# Patient Record
Sex: Male | Born: 1975 | Race: Black or African American | Hispanic: No | Smoking: Current some day smoker
Health system: Southern US, Community
[De-identification: ages and names within clinical notes are randomized; demographics above are authoritative.]

## PROBLEM LIST (undated history)

## (undated) DIAGNOSIS — M545 Low back pain, unspecified: Secondary | ICD-10-CM

## (undated) DIAGNOSIS — K852 Alcohol induced acute pancreatitis without necrosis or infection: Secondary | ICD-10-CM

## (undated) DIAGNOSIS — R569 Unspecified convulsions: Secondary | ICD-10-CM

## (undated) DIAGNOSIS — G8929 Other chronic pain: Secondary | ICD-10-CM

## (undated) DIAGNOSIS — I10 Essential (primary) hypertension: Secondary | ICD-10-CM

---

## 2002-12-06 HISTORY — PX: ACHILLES TENDON REPAIR: SUR1153

## 2015-04-06 DIAGNOSIS — R569 Unspecified convulsions: Secondary | ICD-10-CM

## 2015-04-06 HISTORY — DX: Unspecified convulsions: R56.9

## 2015-10-21 ENCOUNTER — Inpatient Hospital Stay (HOSPITAL_BASED_OUTPATIENT_CLINIC_OR_DEPARTMENT_OTHER)
Admission: EM | Admit: 2015-10-21 | Discharge: 2015-10-30 | DRG: 438 | Disposition: A | Discharge status: Home / Self Care | Payer: Self-pay | Attending: Internal Medicine | Admitting: Internal Medicine

## 2015-10-21 ENCOUNTER — Encounter (HOSPITAL_BASED_OUTPATIENT_CLINIC_OR_DEPARTMENT_OTHER): Payer: Self-pay

## 2015-10-21 ENCOUNTER — Emergency Department (HOSPITAL_BASED_OUTPATIENT_CLINIC_OR_DEPARTMENT_OTHER): Payer: Self-pay

## 2015-10-21 DIAGNOSIS — F419 Anxiety disorder, unspecified: Secondary | ICD-10-CM | POA: Diagnosis present

## 2015-10-21 DIAGNOSIS — J189 Pneumonia, unspecified organism: Secondary | ICD-10-CM

## 2015-10-21 DIAGNOSIS — R0902 Hypoxemia: Secondary | ICD-10-CM

## 2015-10-21 DIAGNOSIS — R509 Fever, unspecified: Secondary | ICD-10-CM | POA: Insufficient documentation

## 2015-10-21 DIAGNOSIS — N141 Nephropathy induced by other drugs, medicaments and biological substances: Secondary | ICD-10-CM | POA: Diagnosis present

## 2015-10-21 DIAGNOSIS — F10239 Alcohol dependence with withdrawal, unspecified: Secondary | ICD-10-CM | POA: Diagnosis present

## 2015-10-21 DIAGNOSIS — J9601 Acute respiratory failure with hypoxia: Secondary | ICD-10-CM | POA: Diagnosis present

## 2015-10-21 DIAGNOSIS — T508X5A Adverse effect of diagnostic agents, initial encounter: Secondary | ICD-10-CM | POA: Diagnosis present

## 2015-10-21 DIAGNOSIS — J90 Pleural effusion, not elsewhere classified: Secondary | ICD-10-CM | POA: Diagnosis present

## 2015-10-21 DIAGNOSIS — R188 Other ascites: Secondary | ICD-10-CM | POA: Diagnosis present

## 2015-10-21 DIAGNOSIS — K567 Ileus, unspecified: Secondary | ICD-10-CM | POA: Diagnosis not present

## 2015-10-21 DIAGNOSIS — R109 Unspecified abdominal pain: Secondary | ICD-10-CM

## 2015-10-21 DIAGNOSIS — R609 Edema, unspecified: Secondary | ICD-10-CM

## 2015-10-21 DIAGNOSIS — G8929 Other chronic pain: Secondary | ICD-10-CM | POA: Diagnosis present

## 2015-10-21 DIAGNOSIS — D696 Thrombocytopenia, unspecified: Secondary | ICD-10-CM | POA: Diagnosis not present

## 2015-10-21 DIAGNOSIS — K859 Acute pancreatitis without necrosis or infection, unspecified: Secondary | ICD-10-CM | POA: Insufficient documentation

## 2015-10-21 DIAGNOSIS — F17200 Nicotine dependence, unspecified, uncomplicated: Secondary | ICD-10-CM | POA: Diagnosis present

## 2015-10-21 DIAGNOSIS — J9811 Atelectasis: Secondary | ICD-10-CM | POA: Diagnosis not present

## 2015-10-21 DIAGNOSIS — F10939 Alcohol use, unspecified with withdrawal, unspecified: Secondary | ICD-10-CM | POA: Clinically undetermined

## 2015-10-21 DIAGNOSIS — N19 Unspecified kidney failure: Secondary | ICD-10-CM

## 2015-10-21 DIAGNOSIS — D539 Nutritional anemia, unspecified: Secondary | ICD-10-CM | POA: Insufficient documentation

## 2015-10-21 DIAGNOSIS — M545 Low back pain: Secondary | ICD-10-CM | POA: Diagnosis present

## 2015-10-21 DIAGNOSIS — E876 Hypokalemia: Secondary | ICD-10-CM | POA: Diagnosis present

## 2015-10-21 DIAGNOSIS — K863 Pseudocyst of pancreas: Secondary | ICD-10-CM

## 2015-10-21 DIAGNOSIS — I10 Essential (primary) hypertension: Secondary | ICD-10-CM | POA: Diagnosis present

## 2015-10-21 DIAGNOSIS — F1010 Alcohol abuse, uncomplicated: Secondary | ICD-10-CM | POA: Diagnosis present

## 2015-10-21 DIAGNOSIS — N179 Acute kidney failure, unspecified: Secondary | ICD-10-CM | POA: Diagnosis present

## 2015-10-21 DIAGNOSIS — Y95 Nosocomial condition: Secondary | ICD-10-CM | POA: Diagnosis not present

## 2015-10-21 DIAGNOSIS — R0600 Dyspnea, unspecified: Secondary | ICD-10-CM

## 2015-10-21 DIAGNOSIS — I429 Cardiomyopathy, unspecified: Secondary | ICD-10-CM | POA: Diagnosis present

## 2015-10-21 DIAGNOSIS — D6959 Other secondary thrombocytopenia: Secondary | ICD-10-CM | POA: Diagnosis present

## 2015-10-21 DIAGNOSIS — N170 Acute kidney failure with tubular necrosis: Secondary | ICD-10-CM | POA: Diagnosis present

## 2015-10-21 DIAGNOSIS — J9690 Respiratory failure, unspecified, unspecified whether with hypoxia or hypercapnia: Secondary | ICD-10-CM | POA: Insufficient documentation

## 2015-10-21 DIAGNOSIS — E861 Hypovolemia: Secondary | ICD-10-CM | POA: Diagnosis present

## 2015-10-21 DIAGNOSIS — K852 Alcohol induced acute pancreatitis without necrosis or infection: Principal | ICD-10-CM | POA: Diagnosis present

## 2015-10-21 DIAGNOSIS — K760 Fatty (change of) liver, not elsewhere classified: Secondary | ICD-10-CM | POA: Diagnosis present

## 2015-10-21 DIAGNOSIS — E872 Acidosis: Secondary | ICD-10-CM | POA: Diagnosis present

## 2015-10-21 HISTORY — DX: Other chronic pain: G89.29

## 2015-10-21 HISTORY — DX: Low back pain: M54.5

## 2015-10-21 HISTORY — DX: Alcohol induced acute pancreatitis without necrosis or infection: K85.20

## 2015-10-21 HISTORY — DX: Unspecified convulsions: R56.9

## 2015-10-21 HISTORY — DX: Essential (primary) hypertension: I10

## 2015-10-21 HISTORY — DX: Low back pain, unspecified: M54.50

## 2015-10-21 LAB — COMPREHENSIVE METABOLIC PANEL
ALT: 47 U/L (ref 17–63)
AST: 77 U/L — ABNORMAL HIGH (ref 15–41)
Albumin: 3.5 g/dL (ref 3.5–5.0)
Alkaline Phosphatase: 54 U/L (ref 38–126)
Anion gap: 11 (ref 5–15)
BUN: 11 mg/dL (ref 6–20)
CALCIUM: 8.5 mg/dL — AB (ref 8.9–10.3)
CHLORIDE: 97 mmol/L — AB (ref 101–111)
CO2: 27 mmol/L (ref 22–32)
CREATININE: 1.67 mg/dL — AB (ref 0.61–1.24)
GFR calc Af Amer: 59 mL/min — ABNORMAL LOW (ref >60)
GFR calc non Af Amer: 50 mL/min — ABNORMAL LOW (ref >60)
GLUCOSE: 162 mg/dL — AB (ref 65–99)
Potassium: 3.2 mmol/L — ABNORMAL LOW (ref 3.5–5.1)
Sodium: 135 mmol/L (ref 135–145)
Total Bilirubin: 1.5 mg/dL — ABNORMAL HIGH (ref 0.3–1.2)
Total Protein: 6.9 g/dL (ref 6.5–8.1)

## 2015-10-21 LAB — CBC WITH DIFFERENTIAL/PLATELET
BASOS PCT: 1 %
Basophils Absolute: 0.1 10*3/uL (ref 0.0–0.1)
Eosinophils Absolute: 0 10*3/uL (ref 0.0–0.7)
Eosinophils Relative: 0 %
HEMATOCRIT: 40.6 % (ref 39.0–52.0)
Hemoglobin: 14 g/dL (ref 13.0–17.0)
LYMPHS PCT: 6 %
Lymphs Abs: 0.5 10*3/uL — ABNORMAL LOW (ref 0.7–4.0)
MCH: 36.6 pg — ABNORMAL HIGH (ref 26.0–34.0)
MCHC: 34.5 g/dL (ref 30.0–36.0)
MCV: 106 fL — AB (ref 78.0–100.0)
MONO ABS: 1.1 10*3/uL — AB (ref 0.1–1.0)
MONOS PCT: 12 %
NEUTROS ABS: 7.3 10*3/uL (ref 1.7–7.7)
Neutrophils Relative %: 81 %
Platelets: 186 10*3/uL (ref 150–400)
RBC: 3.83 MIL/uL — ABNORMAL LOW (ref 4.22–5.81)
RDW: 13.4 % (ref 11.5–15.5)
WBC: 8.9 10*3/uL (ref 4.0–10.5)

## 2015-10-21 LAB — LIPASE, BLOOD: Lipase: 1924 U/L — ABNORMAL HIGH (ref 11–51)

## 2015-10-21 LAB — PROTIME-INR
INR: 1.03 (ref 0.00–1.49)
Prothrombin Time: 13.7 seconds (ref 11.6–15.2)

## 2015-10-21 LAB — MAGNESIUM: Magnesium: 1.4 mg/dL — ABNORMAL LOW (ref 1.7–2.4)

## 2015-10-21 MED ORDER — HEPARIN SODIUM (PORCINE) 5000 UNIT/ML IJ SOLN
5000.0000 [IU] | Freq: Three times a day (TID) | INTRAMUSCULAR | Status: DC
  Administered 2015-10-21 – 2015-10-22 (×3): 5000 [IU] via SUBCUTANEOUS
  Filled 2015-10-21 (×3): qty 1

## 2015-10-21 MED ORDER — LORAZEPAM 2 MG/ML IJ SOLN
1.0000 mg | Freq: Once | INTRAMUSCULAR | Status: AC
  Administered 2015-10-21: 1 mg via INTRAVENOUS
  Filled 2015-10-21: qty 1

## 2015-10-21 MED ORDER — HYDROMORPHONE HCL 1 MG/ML IJ SOLN
1.0000 mg | INTRAMUSCULAR | Status: AC | PRN
  Administered 2015-10-21 (×2): 1 mg via INTRAVENOUS
  Filled 2015-10-21 (×2): qty 1

## 2015-10-21 MED ORDER — LORAZEPAM 1 MG PO TABS
1.0000 mg | ORAL_TABLET | Freq: Four times a day (QID) | ORAL | Status: DC | PRN
Start: 2015-10-21 — End: 2015-10-22

## 2015-10-21 MED ORDER — MAGNESIUM SULFATE 2 GM/50ML IV SOLN
2.0000 g | Freq: Once | INTRAVENOUS | Status: AC
  Administered 2015-10-21: 2 g via INTRAVENOUS
  Filled 2015-10-21: qty 50

## 2015-10-21 MED ORDER — ADULT MULTIVITAMIN W/MINERALS CH
1.0000 | ORAL_TABLET | Freq: Every day | ORAL | Status: DC
  Administered 2015-10-21 – 2015-10-30 (×10): 1 via ORAL
  Filled 2015-10-21 (×12): qty 1

## 2015-10-21 MED ORDER — SODIUM CHLORIDE 0.9 % IV BOLUS (SEPSIS)
1000.0000 mL | Freq: Once | INTRAVENOUS | Status: AC
  Administered 2015-10-21: 1000 mL via INTRAVENOUS

## 2015-10-21 MED ORDER — THIAMINE HCL 100 MG/ML IJ SOLN
100.0000 mg | Freq: Every day | INTRAMUSCULAR | Status: DC
  Filled 2015-10-21 (×5): qty 1

## 2015-10-21 MED ORDER — SODIUM CHLORIDE 0.9 % IV SOLN
INTRAVENOUS | Status: DC
  Administered 2015-10-21 – 2015-10-22 (×2): via INTRAVENOUS
  Administered 2015-10-22: 125 mL/h via INTRAVENOUS
  Administered 2015-10-22 – 2015-10-25 (×6): via INTRAVENOUS

## 2015-10-21 MED ORDER — VITAMIN B-1 100 MG PO TABS
100.0000 mg | ORAL_TABLET | Freq: Every day | ORAL | Status: DC
  Administered 2015-10-21 – 2015-10-30 (×10): 100 mg via ORAL
  Filled 2015-10-21 (×12): qty 1

## 2015-10-21 MED ORDER — MORPHINE SULFATE (PF) 2 MG/ML IV SOLN
2.0000 mg | INTRAVENOUS | Status: DC | PRN
  Administered 2015-10-21 – 2015-10-22 (×4): 4 mg via INTRAVENOUS
  Filled 2015-10-21 (×4): qty 2

## 2015-10-21 MED ORDER — FOLIC ACID 1 MG PO TABS
1.0000 mg | ORAL_TABLET | Freq: Every day | ORAL | Status: DC
  Administered 2015-10-21 – 2015-10-30 (×10): 1 mg via ORAL
  Filled 2015-10-21 (×12): qty 1

## 2015-10-21 MED ORDER — HYDROMORPHONE HCL 1 MG/ML IJ SOLN
1.0000 mg | Freq: Once | INTRAMUSCULAR | Status: AC
  Administered 2015-10-21: 1 mg via INTRAVENOUS
  Filled 2015-10-21: qty 1

## 2015-10-21 MED ORDER — LORAZEPAM 2 MG/ML IJ SOLN
1.0000 mg | Freq: Four times a day (QID) | INTRAMUSCULAR | Status: DC | PRN
  Administered 2015-10-22: 1 mg via INTRAVENOUS
  Filled 2015-10-21: qty 1

## 2015-10-21 MED ORDER — IOHEXOL 300 MG/ML  SOLN
80.0000 mL | Freq: Once | INTRAMUSCULAR | Status: AC | PRN
  Administered 2015-10-21: 80 mL via INTRAVENOUS

## 2015-10-21 MED ORDER — ONDANSETRON HCL 4 MG/2ML IJ SOLN
4.0000 mg | Freq: Once | INTRAMUSCULAR | Status: AC
  Administered 2015-10-21: 4 mg via INTRAVENOUS
  Filled 2015-10-21: qty 2

## 2015-10-21 MED ORDER — POTASSIUM CHLORIDE 10 MEQ/100ML IV SOLN
10.0000 meq | Freq: Once | INTRAVENOUS | Status: AC
  Administered 2015-10-21: 10 meq via INTRAVENOUS
  Filled 2015-10-21: qty 100

## 2015-10-21 MED ORDER — SODIUM CHLORIDE 0.9 % IJ SOLN
3.0000 mL | Freq: Two times a day (BID) | INTRAMUSCULAR | Status: DC
  Administered 2015-10-22 – 2015-10-29 (×6): 3 mL via INTRAVENOUS

## 2015-10-21 NOTE — H&P (Signed)
[facility] History and Physical  Larry SchatzCalvin Maldonado ZOX:[id number] DOB: 1976-06-09 DOA: 10/21/2015  Referring physician: EDP PCP: No primary care provider on file.   Chief Complaint: Abd pain   HPI: Larry SchatzCalvin Carver is a 39 y.o. male who [facility] with c/o epigastric abdominal pain.  Pain onset last night, located in epigastric area.  Mild nausea but no vomiting.  Pain is severe with radiation to back.  Does drink "several beers" daily.  Has had "mild" EtOH withdrawal in the past with seizure.  Review of Systems: Systems reviewed.  As above, otherwise negative  Past Medical History  Diagnosis Date  . Hypertension    Past Surgical History  Procedure Laterality Date  . Achilles tendon repair     Social History:  reports that he has quit smoking. He does not have any smokeless tobacco history on file. He reports that he drinks alcohol. He reports that he does not use illicit drugs.  No Known Allergies  No family history on file.   Prior to Admission medications   Not on File   Physical Exam: Filed Vitals:   10/21/15 2100  BP: 147/90  Pulse: 126  Temp: 100.3 F (37.9 C)  Resp: 18    BP 147/90 mmHg  Pulse 126  Temp(Src) 100.3 F (37.9 C) (Oral)  Resp 18  Ht 6\' 4"  (1.93 m)  Wt 106.595 kg (235 lb)  BMI 28.62 kg/m2  SpO2 97%  General Appearance:    Alert, oriented, no distress, appears stated age  Head:    Normocephalic, atraumatic  Eyes:    PERRL, EOMI, sclera non-icteric        Nose:   Nares without drainage or epistaxis. Mucosa, turbinates normal  Throat:   Moist mucous membranes. Oropharynx without erythema or exudate.  Neck:   Supple. No carotid bruits.  No thyromegaly.  No lymphadenopathy.   Back:     No CVA tenderness, no spinal tenderness  Lungs:     Clear to auscultation bilaterally, without wheezes, rhonchi or rales  Chest wall:    No tenderness to palpitation  Heart:    Regular rate and rhythm without murmurs, gallops, rubs  Abdomen:      Soft, Epigastric tenderness, nondistended, normal bowel sounds, no organomegaly  Genitalia:    deferred  Rectal:    deferred  Extremities:   No clubbing, cyanosis or edema.  Pulses:   2+ and symmetric all extremities  Skin:   Skin color, texture, turgor normal, no rashes or lesions  Lymph nodes:   Cervical, supraclavicular, and axillary nodes normal  Neurologic:   CNII-XII intact. Normal strength, sensation and reflexes      throughout    Labs on Admission:  Basic Metabolic Panel:  Recent Labs Lab 10/21/15 1300  NA 135  K 3.2*  CL 97*  CO2 27  GLUCOSE 162*  BUN 11  CREATININE 1.67*  CALCIUM 8.5*  MG 1.4*   Liver Function Tests:  Recent Labs Lab 10/21/15 1300  AST 77*  ALT 47  ALKPHOS 54  BILITOT 1.5*  PROT 6.9  ALBUMIN 3.5    Recent Labs Lab 10/21/15 1300  LIPASE 1924*   No results for input(s): AMMONIA in the last 168 hours. CBC:  Recent Labs Lab 10/21/15 1300  WBC 8.9  NEUTROABS 7.3  HGB 14.0  HCT 40.6  MCV 106.0*  PLT 186   Cardiac Enzymes: No results for input(s): CKTOTAL, CKMB, CKMBINDEX, TROPONINI in the last 168 hours.  BNP (  last 3 results) No results for input(s): PROBNP in the last 8760 hours. CBG: No results for input(s): GLUCAP in the last 168 hours.  Radiological Exams on Admission: Ct Abdomen Pelvis W Contrast  10/21/2015  CLINICAL DATA:  Upper abdominal pain for 1 day. History of alcoholism. EXAM: CT ABDOMEN AND PELVIS WITH CONTRAST TECHNIQUE: Multidetector CT imaging of the abdomen and pelvis was performed using the standard protocol following bolus administration of intravenous contrast. CONTRAST:  80mL OMNIPAQUE IOHEXOL 300 MG/ML  SOLN COMPARISON:  None. FINDINGS: Lower chest: No significant pulmonary nodules or acute consolidative airspace disease. Hepatobiliary: Diffuse hepatic steatosis. Simple 1.3 cm right liver lobe cyst. No additional liver lesions. No gross liver surface irregularity. Normal gallbladder with no radiopaque  cholelithiasis. No biliary ductal dilatation. Pancreas: There is diffuse thickening of the entire pancreas with prominent peripancreatic fat stranding and ill-defined fluid along the entire length of the pancreas, in keeping with acute pancreatitis. There is a nonspecific 1.0 x 0.7 cm cystic focus in the pancreatic body (series 2/image 35). No main pancreatic duct dilation. No additional focal pancreatic lesions. No pancreatic gas or areas of pancreatic parenchymal hypoenhancement. There is extension of inflammatory fluid into the left greater than right bilateral anterior para renal retroperitoneal space. There is extension of inflammatory fluid into the central mesentery and gastrocolic ligament, with a 7.2 x 2.5 x 3.9 cm left gastrocolic ligament fluid collection. Spleen: Normal size. No mass. Adrenals/Urinary Tract: Normal adrenals. Normal kidneys with no hydronephrosis and no renal mass. Normal bladder. Stomach/Bowel: Grossly normal stomach. Normal caliber small bowel with no small bowel wall thickening. Normal appendix. Normal large bowel with no diverticulosis, large bowel wall thickening or pericolonic fat stranding. Vascular/Lymphatic: Normal caliber abdominal aorta. Patent portal, splenic, hepatic and renal veins. No pathologically enlarged lymph nodes in the abdomen or pelvis. Reproductive: Normal size prostate. Other: No pneumoperitoneum. Small volume ascites, predominantly perihepatic and deep pelvic. Musculoskeletal: No aggressive appearing focal osseous lesions. Severe degenerative disc disease at L5-S1. Prominent Schmorl's nodes in the superior and inferior endplates of the L4 vertebral body. Mild degenerative changes throughout the remaining visualized thoracolumbar spine. Mild gynecomastia, slightly asymmetric to the left. Small fat containing umbilical hernia. Suggestion of a small fat containing left inguinal hernia. IMPRESSION: 1. Acute pancreatitis. No evidence of necrotizing pancreatitis. No  vascular complication. 2. Nonspecific 1.0 cm cystic focus in the pancreatic body, which could represent a developing pseudocyst. Recommend attention on follow-up MRI abdomen with and without intravenous contrast in 3 months. 3. Prominent inflammatory fluid extending into the bilateral retroperitoneum and gastrocolic ligament, with a developing left gastrocolic ligament fluid collection as described. 4. Small volume ascites. 5. Diffuse hepatic steatosis. Electronically Signed   By: Delbert Phenix M.D.   On: 10/21/2015 15:22    EKG: Independently reviewed.  Assessment/Plan Principal Problem:   Acute alcoholic pancreatitis Active Problems:   ETOH abuse   1. Acute EtOH pancreatitis -  1. IVF 2. Intake and output 3. Pain control 4. NPO 5. Repeat CBC / CMP in AM 6. No fever or leukocytosis to indicate ABx at this point, obtain cultures and start if this develops. 2. EtOH abuse - CIWA    Code Status: Full  Family Communication: No family in room Disposition Plan: Admit to inpatient   Time spent: 70 min  GARDNER, JARED M. [facility] Pager [phone]  If 7AM-7PM, please contact the day team taking care of the patient [email or web address] Password [email or web address] 10/21/2015, 10:06 PM

## 2015-10-21 NOTE — Progress Notes (Signed)
39 yr old with alcoholic pancreatitis , hypokalemia  ,, AKI, [facility] , accepted to tele

## 2015-10-21 NOTE — ED Notes (Addendum)
C/o pain to entire abd since last night-denies n/v/d-states it hurts to take a deep breath-pt in no resp distress-during triage, pt's mother states pt with hx of alcohol abuse-pt admits to daily ETOH with last intake last night

## 2015-10-21 NOTE — ED Provider Notes (Signed)
CSN: [id number]     Arrival date & time 10/21/15  1129 History   First MD Initiated Contact with Patient 10/21/15 1322     Chief Complaint  Patient presents with  . Abdominal Pain      HPI  She presents for evaluation of abdominal pain. Pain string early this morning. Mild nausea but no vomiting. Severe epigastric pain and diffuse anterior abdominal pain rating to his mid line back.  Past episodes. Patient does drink daily. States he drinks "several cups" a fog. States this week and he "only had beer". He said one previous seizure in the past that he can recall. Essentially this was during an episode of withdrawal. He sits on days he does not drink he will start Habitrol symptoms by day 2 or 3. No history of otitis or liver abnormalities.  Past Medical History  Diagnosis Date  . Hypertension    Past Surgical History  Procedure Laterality Date  . Achilles tendon repair     No family history on file. Social History  Substance Use Topics  . Smoking status: Former Games developer  . Smokeless tobacco: None  . Alcohol Use: Yes     Comment: daily    Review of Systems  Constitutional: Negative for fever, chills, diaphoresis, appetite change and fatigue.  HENT: Negative for mouth sores, sore throat and trouble swallowing.   Eyes: Negative for visual disturbance.  Respiratory: Negative for cough, chest tightness, shortness of breath and wheezing.   Cardiovascular: Negative for chest pain.  Gastrointestinal: Positive for nausea, vomiting and abdominal pain. Negative for abdominal distention.  Endocrine: Negative for polydipsia, polyphagia and polyuria.  Genitourinary: Negative for dysuria, frequency and hematuria.  Musculoskeletal: Negative for gait problem.  Skin: Negative for color change, pallor and rash.  Neurological: Negative for dizziness, syncope, light-headedness and headaches.  Hematological: Does not bruise/bleed easily.  Psychiatric/Behavioral: Negative for behavioral problems  and confusion.      Allergies  Review of patient's allergies indicates no known allergies.  Home Medications   Prior to Admission medications   Not on File   BP 134/95 mmHg  Pulse 112  Temp(Src) 98.6 F (37 C) (Oral)  Resp 18  Ht  (1.93 m)  Wt 235 lb (106.595 kg)  BMI 28.62 kg/m2  SpO2 99% Physical Exam  Constitutional: He is oriented to person, place, and time. He appears well-developed and well-nourished. No distress.  HENT:  Head: Normocephalic.  Eyes: Conjunctivae are normal. Pupils are equal, round, and reactive to light. No scleral icterus.  Neck: Normal range of motion. Neck supple. No thyromegaly present.  Cardiovascular: Normal rate and regular rhythm.  Exam reveals no gallop and no friction rub.   No murmur heard. Pulmonary/Chest: Effort normal and breath sounds normal. No respiratory distress. He has no wheezes. He has no rales.  Abdominal: Soft. Bowel sounds are normal. He exhibits distension. He exhibits no shifting dullness, no fluid wave and no ascites. There is tenderness in the epigastric area. There is no rebound.  Musculoskeletal: Normal range of motion.  Neurological: He is alert and oriented to person, place, and time.  Skin: Skin is warm and dry. No rash noted.  Psychiatric: He has a normal mood and affect. His behavior is normal.    ED Course  Procedures (including critical care time) Labs Review Labs Reviewed  CBC WITH DIFFERENTIAL/PLATELET - Abnormal; [id number] for the following:    RBC 3.83 (*)    MCV 106.0 (*)    MCH 36.6 (*)  Lymphs Abs 0.5 (*)    Monocytes Absolute 1.1 (*)    All other components within normal limits  COMPREHENSIVE METABOLIC PANEL - Abnormal; Notable for the following:    Potassium 3.2 (*)    Chloride 97 (*)    Glucose, Bld 162 (*)    Creatinine, Ser 1.67 (*)    Calcium 8.5 (*)    AST 77 (*)    Total Bilirubin 1.5 (*)    GFR calc non Af Amer 50 (*)    GFR calc Af Amer 59 (*)    All other components within  normal limits  LIPASE, BLOOD - Abnormal; Notable for the following:    Lipase 1924 (*)    All other components within normal limits  URINALYSIS, ROUTINE W REFLEX MICROSCOPIC (NOT AT [facility])  PROTIME-INR  MAGNESIUM    Imaging Review Ct Abdomen Pelvis W Contrast  10/21/2015  CLINICAL DATA:  Upper abdominal pain for 1 day. History of alcoholism. EXAM: CT ABDOMEN AND PELVIS WITH CONTRAST TECHNIQUE: Multidetector CT imaging of the abdomen and pelvis was performed using the standard protocol following bolus administration of intravenous contrast. CONTRAST:  80mL OMNIPAQUE IOHEXOL 300 MG/ML  SOLN COMPARISON:  None. FINDINGS: Lower chest: No significant pulmonary nodules or acute consolidative airspace disease. Hepatobiliary: Diffuse hepatic steatosis. Simple 1.3 cm right liver lobe cyst. No additional liver lesions. No gross liver surface irregularity. Normal gallbladder with no radiopaque cholelithiasis. No biliary ductal dilatation. Pancreas: There is diffuse thickening of the entire pancreas with prominent peripancreatic fat stranding and ill-defined fluid along the entire length of the pancreas, in keeping with acute pancreatitis. There is a nonspecific 1.0 x 0.7 cm cystic focus in the pancreatic body (series 2/image 35). No main pancreatic duct dilation. No additional focal pancreatic lesions. No pancreatic gas or areas of pancreatic parenchymal hypoenhancement. There is extension of inflammatory fluid into the left greater than right bilateral anterior para renal retroperitoneal space. There is extension of inflammatory fluid into the central mesentery and gastrocolic ligament, with a 7.2 x 2.5 x 3.9 cm left gastrocolic ligament fluid collection. Spleen: Normal size. No mass. Adrenals/Urinary Tract: Normal adrenals. Normal kidneys with no hydronephrosis and no renal mass. Normal bladder. Stomach/Bowel: Grossly normal stomach. Normal caliber small bowel with no small bowel wall thickening. Normal appendix.  Normal large bowel with no diverticulosis, large bowel wall thickening or pericolonic fat stranding. Vascular/Lymphatic: Normal caliber abdominal aorta. Patent portal, splenic, hepatic and renal veins. No pathologically enlarged lymph nodes in the abdomen or pelvis. Reproductive: Normal size prostate. Other: No pneumoperitoneum. Small volume ascites, predominantly perihepatic and deep pelvic. Musculoskeletal: No aggressive appearing focal osseous lesions. Severe degenerative disc disease at L5-S1. Prominent Schmorl's nodes in the superior and inferior endplates of the L4 vertebral body. Mild degenerative changes throughout the remaining visualized thoracolumbar spine. Mild gynecomastia, slightly asymmetric to the left. Small fat containing umbilical hernia. Suggestion of a small fat containing left inguinal hernia. IMPRESSION: 1. Acute pancreatitis. No evidence of necrotizing pancreatitis. No vascular complication. 2. Nonspecific 1.0 cm cystic focus in the pancreatic body, which could represent a developing pseudocyst. Recommend attention on follow-up MRI abdomen with and without intravenous contrast in 3 months. 3. Prominent inflammatory fluid extending into the bilateral retroperitoneum and gastrocolic ligament, with a developing left gastrocolic ligament fluid collection as described. 4. Small volume ascites. 5. Diffuse hepatic steatosis. Electronically Signed   By: Delbert Phenix M.D.   On: 10/21/2015 15:22   I have personally reviewed and evaluated these images and lab  results as part of my medical decision-making.   EKG Interpretation None      MDM   Final diagnoses:  Acute pancreatitis, unspecified pancreatitis type    CT and labs show a pancreatitis without obvious abscess. However marked inflammatory fluid noted. Patient had improvement after IV pain medications. Not showing significant withdrawal as she had a mild tremor. Given Ativan for this. Potassium is being replaced. Magnesium pending.  Care discussed with Dr. Domenica FailAcuna. [facility]    Rolland PorterMark Leverett Camplin, MD 10/21/15 [phone]

## 2015-10-21 NOTE — Progress Notes (Signed)
Patient just arrived to [facility]. Updated report received from [facility]. Patient stable.

## 2015-10-21 NOTE — Progress Notes (Signed)
MD paged for patient arrival to floor. Awaiting response.

## 2015-10-22 ENCOUNTER — Inpatient Hospital Stay (HOSPITAL_COMMUNITY): Payer: Self-pay

## 2015-10-22 DIAGNOSIS — N179 Acute kidney failure, unspecified: Secondary | ICD-10-CM | POA: Diagnosis present

## 2015-10-22 DIAGNOSIS — J9690 Respiratory failure, unspecified, unspecified whether with hypoxia or hypercapnia: Secondary | ICD-10-CM | POA: Insufficient documentation

## 2015-10-22 DIAGNOSIS — D696 Thrombocytopenia, unspecified: Secondary | ICD-10-CM

## 2015-10-22 DIAGNOSIS — F10239 Alcohol dependence with withdrawal, unspecified: Secondary | ICD-10-CM | POA: Clinically undetermined

## 2015-10-22 DIAGNOSIS — J9601 Acute respiratory failure with hypoxia: Secondary | ICD-10-CM

## 2015-10-22 DIAGNOSIS — R000 Tachycardia, unspecified: Secondary | ICD-10-CM

## 2015-10-22 DIAGNOSIS — F1010 Alcohol abuse, uncomplicated: Secondary | ICD-10-CM

## 2015-10-22 DIAGNOSIS — F10939 Alcohol use, unspecified with withdrawal, unspecified: Secondary | ICD-10-CM | POA: Clinically undetermined

## 2015-10-22 DIAGNOSIS — F10230 Alcohol dependence with withdrawal, uncomplicated: Secondary | ICD-10-CM

## 2015-10-22 LAB — COMPREHENSIVE METABOLIC PANEL
ALT: 36 U/L (ref 17–63)
ANION GAP: 10 (ref 5–15)
AST: 56 U/L — ABNORMAL HIGH (ref 15–41)
Albumin: 2.5 g/dL — ABNORMAL LOW (ref 3.5–5.0)
Alkaline Phosphatase: 43 U/L (ref 38–126)
BUN: 17 mg/dL (ref 6–20)
CHLORIDE: 101 mmol/L (ref 101–111)
CO2: 25 mmol/L (ref 22–32)
CREATININE: 3.2 mg/dL — AB (ref 0.61–1.24)
Calcium: 8.2 mg/dL — ABNORMAL LOW (ref 8.9–10.3)
GFR calc Af Amer: 27 mL/min — ABNORMAL LOW (ref >60)
GFR, EST NON AFRICAN AMERICAN: 23 mL/min — AB (ref >60)
Glucose, Bld: 113 mg/dL — ABNORMAL HIGH (ref 65–99)
Potassium: 3.6 mmol/L (ref 3.5–5.1)
SODIUM: 136 mmol/L (ref 135–145)
Total Bilirubin: 1.6 mg/dL — ABNORMAL HIGH (ref 0.3–1.2)
Total Protein: 5.5 g/dL — ABNORMAL LOW (ref 6.5–8.1)

## 2015-10-22 LAB — CBC
HCT: 36.9 % — ABNORMAL LOW (ref 39.0–52.0)
HEMOGLOBIN: 13 g/dL (ref 13.0–17.0)
MCH: 37 pg — AB (ref 26.0–34.0)
MCHC: 35.2 g/dL (ref 30.0–36.0)
MCV: 105.1 fL — AB (ref 78.0–100.0)
PLATELETS: 80 10*3/uL — AB (ref 150–400)
RBC: 3.51 MIL/uL — AB (ref 4.22–5.81)
RDW: 15 % (ref 11.5–15.5)
WBC: 9.1 10*3/uL (ref 4.0–10.5)

## 2015-10-22 LAB — BLOOD GAS, ARTERIAL
ACID-BASE DEFICIT: 3.6 mmol/L — AB (ref 0.0–2.0)
Bicarbonate: 20 mEq/L (ref 20.0–24.0)
DRAWN BY: 44589
FIO2: 0.21
O2 Content: 4 L/min
O2 Saturation: 94.1 %
PATIENT TEMPERATURE: 98.6
PCO2 ART: 30.9 mmHg — AB (ref 35.0–45.0)
PO2 ART: 70.2 mmHg — AB (ref 80.0–100.0)
TCO2: 21 mmol/L (ref 0–100)
pH, Arterial: 7.427 (ref 7.350–7.450)

## 2015-10-22 LAB — URINALYSIS, ROUTINE W REFLEX MICROSCOPIC
GLUCOSE, UA: NEGATIVE mg/dL
Ketones, ur: 15 mg/dL — AB
NITRITE: POSITIVE — AB
PH: 5 (ref 5.0–8.0)
Protein, ur: 100 mg/dL — AB
SPECIFIC GRAVITY, URINE: 1.035 — AB (ref 1.005–1.030)

## 2015-10-22 LAB — DIC (DISSEMINATED INTRAVASCULAR COAGULATION)PANEL
INR: 1.2 (ref 0.00–1.49)
Prothrombin Time: 15.4 seconds — ABNORMAL HIGH (ref 11.6–15.2)
aPTT: 31 seconds (ref 24–37)

## 2015-10-22 LAB — BASIC METABOLIC PANEL
Anion gap: 8 (ref 5–15)
BUN: 25 mg/dL — AB (ref 6–20)
CALCIUM: 7.4 mg/dL — AB (ref 8.9–10.3)
CO2: 22 mmol/L (ref 22–32)
CREATININE: 3.59 mg/dL — AB (ref 0.61–1.24)
Chloride: 106 mmol/L (ref 101–111)
GFR calc Af Amer: 23 mL/min — ABNORMAL LOW (ref >60)
GFR, EST NON AFRICAN AMERICAN: 20 mL/min — AB (ref >60)
GLUCOSE: 104 mg/dL — AB (ref 65–99)
POTASSIUM: 4.2 mmol/L (ref 3.5–5.1)
SODIUM: 136 mmol/L (ref 135–145)

## 2015-10-22 LAB — DIC (DISSEMINATED INTRAVASCULAR COAGULATION) PANEL
FIBRINOGEN: 582 mg/dL — AB (ref 204–475)
PLATELETS: 72 10*3/uL — AB (ref 150–400)
SMEAR REVIEW: NONE SEEN

## 2015-10-22 LAB — SODIUM, URINE, RANDOM: Sodium, Ur: 19 mmol/L

## 2015-10-22 LAB — CREATININE, URINE, RANDOM: CREATININE, URINE: 284.68 mg/dL

## 2015-10-22 LAB — TSH: TSH: 3.614 u[IU]/mL (ref 0.350–4.500)

## 2015-10-22 LAB — LACTIC ACID, PLASMA: LACTIC ACID, VENOUS: 1.7 mmol/L (ref 0.5–2.0)

## 2015-10-22 LAB — URINE MICROSCOPIC-ADD ON: Bacteria, UA: NONE SEEN

## 2015-10-22 LAB — PROCALCITONIN: Procalcitonin: 6.94 ng/mL

## 2015-10-22 LAB — LIPASE, BLOOD: Lipase: 1145 U/L — ABNORMAL HIGH (ref 11–51)

## 2015-10-22 MED ORDER — SODIUM CHLORIDE 0.9 % IV SOLN
1250.0000 mg | INTRAVENOUS | Status: DC
  Filled 2015-10-22: qty 1250

## 2015-10-22 MED ORDER — VANCOMYCIN HCL 10 G IV SOLR
2000.0000 mg | Freq: Once | INTRAVENOUS | Status: AC
  Administered 2015-10-22: 2000 mg via INTRAVENOUS
  Filled 2015-10-22: qty 2000

## 2015-10-22 MED ORDER — SODIUM CHLORIDE 0.9 % IV BOLUS (SEPSIS)
500.0000 mL | Freq: Once | INTRAVENOUS | Status: AC
  Administered 2015-10-22: 500 mL via INTRAVENOUS

## 2015-10-22 MED ORDER — LORAZEPAM 1 MG PO TABS: 1.0000 mg | ORAL_TABLET | Freq: Four times a day (QID) | ORAL | Status: AC | PRN

## 2015-10-22 MED ORDER — SODIUM CHLORIDE 0.9 % IV BOLUS (SEPSIS)
1000.0000 mL | Freq: Once | INTRAVENOUS | Status: AC
  Administered 2015-10-22: 1000 mL via INTRAVENOUS

## 2015-10-22 MED ORDER — FENTANYL CITRATE (PF) 100 MCG/2ML IJ SOLN
INTRAMUSCULAR | Status: AC
  Filled 2015-10-22: qty 2

## 2015-10-22 MED ORDER — FENTANYL CITRATE (PF) 100 MCG/2ML IJ SOLN
25.0000 ug | INTRAMUSCULAR | Status: DC | PRN
  Administered 2015-10-22 – 2015-10-23 (×4): 100 ug via INTRAVENOUS
  Filled 2015-10-22 (×3): qty 2

## 2015-10-22 MED ORDER — ACETAMINOPHEN 325 MG PO TABS
650.0000 mg | ORAL_TABLET | Freq: Four times a day (QID) | ORAL | Status: DC | PRN
  Administered 2015-10-22 – 2015-10-25 (×7): 650 mg via ORAL
  Filled 2015-10-22 (×7): qty 2

## 2015-10-22 MED ORDER — LORAZEPAM 2 MG/ML IJ SOLN
2.0000 mg | Freq: Once | INTRAMUSCULAR | Status: AC
  Administered 2015-10-22: 2 mg via INTRAVENOUS

## 2015-10-22 MED ORDER — SODIUM CHLORIDE 0.9 % IV BOLUS (SEPSIS): 500.0000 mL | Freq: Once | INTRAVENOUS | Status: DC

## 2015-10-22 MED ORDER — SODIUM CHLORIDE 0.9 % IV SOLN
500.0000 mg | Freq: Three times a day (TID) | INTRAVENOUS | Status: DC
  Administered 2015-10-22 – 2015-10-23 (×3): 500 mg via INTRAVENOUS
  Filled 2015-10-22 (×5): qty 500

## 2015-10-22 MED ORDER — ONDANSETRON HCL 4 MG/2ML IJ SOLN: 4.0000 mg | Freq: Four times a day (QID) | INTRAMUSCULAR | Status: DC | PRN

## 2015-10-22 MED ORDER — FENTANYL CITRATE (PF) 100 MCG/2ML IJ SOLN
25.0000 ug | INTRAMUSCULAR | Status: DC | PRN
Start: 2015-10-22 — End: 2015-10-22

## 2015-10-22 MED ORDER — LORAZEPAM 2 MG/ML IJ SOLN
0.0000 mg | Freq: Four times a day (QID) | INTRAMUSCULAR | Status: AC
  Administered 2015-10-22 – 2015-10-23 (×3): 1 mg via INTRAVENOUS
  Administered 2015-10-23: 4 mg via INTRAVENOUS
  Administered 2015-10-23 – 2015-10-24 (×3): 1 mg via INTRAVENOUS
  Filled 2015-10-22 (×6): qty 1

## 2015-10-22 MED ORDER — LORAZEPAM 2 MG/ML IJ SOLN
INTRAMUSCULAR | Status: AC
  Filled 2015-10-22: qty 1

## 2015-10-22 MED ORDER — FENTANYL CITRATE (PF) 100 MCG/2ML IJ SOLN
100.0000 ug | Freq: Once | INTRAMUSCULAR | Status: AC
  Administered 2015-10-22: 100 ug via INTRAVENOUS

## 2015-10-22 MED ORDER — LORAZEPAM 2 MG/ML IJ SOLN
0.0000 mg | Freq: Two times a day (BID) | INTRAMUSCULAR | Status: AC
Start: 2015-10-24 — End: 2015-10-26
  Administered 2015-10-24: 1 mg via INTRAVENOUS
  Filled 2015-10-22 (×2): qty 1
  Filled 2015-10-22: qty 2

## 2015-10-22 MED ORDER — LORAZEPAM 2 MG/ML IJ SOLN
1.0000 mg | Freq: Four times a day (QID) | INTRAMUSCULAR | Status: AC | PRN
  Administered 2015-10-22 – 2015-10-23 (×2): 1 mg via INTRAVENOUS
  Administered 2015-10-23: 2 mg via INTRAVENOUS
  Filled 2015-10-22 (×2): qty 1

## 2015-10-22 NOTE — Consult Note (Signed)
Name: Larry Maldonado MRN: [id number] DOB: 27-Apr-1976    ADMISSION DATE:  10/21/2015 CONSULTATION DATE:  10/21/15  REFERRING MD :  Rito Ehrlich  CHIEF COMPLAINT:  Abd pain  BRIEF PATIENT DESCRIPTION: 39 y.o. male  admitted to [facility] 11/15 with ETOH pancreatitis.  On 11/16, developed fever and sinus tach; therefore,  PCCM called for further recs.  STUDIES: CT A / P 11/15 >>> acute pancreatitis without evidence of necrotizing pancreatitis.  Nonspecific 1.0cm cyst in pancreatic body which could represent developing pseudocyst.  Inflammatory fluid in retroperitoneum.  Small ascites with diffuse hepatic steatosis. RUQ [place] 11/15 >>> no gallstones.  GB thickening up to 8mm.  SIGNIFICANT EVENTS:  11/15 - admitted with ETOH pancreatitis. 11/16 - PCCM consult for fever and tachycardia.   HISTORY OF PRESENT ILLNESS:  Larry Maldonado is a 39 y.o. male with a PMH as outlined below.  He presented to [facility] ED 11/15 with epigastric abdominal pain that began 1 night prior.  He had some mild nausea but never vomited.  Pain got too unbearable therefore he came to ED for further evaluation. 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  Workup was suggestive of alcoholic pancreatitis.  He was admitted by [facility] for further management.  On 11/16, he developed fever (Tmax 102) and tachycardia.  PCCM was consulted for further recs.   PAST MEDICAL HISTORY :   has a past medical history of Hypertension; Chronic lower back pain; Acute alcoholic pancreatitis (10/21/2015); and Alcohol related seizure (HCC) (04/2015).  has past surgical history that includes Achilles tendon repair (Left, 2004). Prior to Admission medications   Not on File   No Known Allergies  FAMILY HISTORY:  family history is not on file. SOCIAL HISTORY:  reports that he has been smoking Cigars.  He has never used smokeless tobacco. He reports that he drinks about 21.0 oz of  alcohol per week. He reports that he does not use illicit drugs.  REVIEW OF SYSTEMS:   All negative; except for those that are bolded, which indicate positives.  Constitutional: weight loss, weight gain, night sweats, fevers, chills, fatigue, weakness.  HEENT: headaches, sore throat, sneezing, nasal congestion, post nasal drip, difficulty swallowing, tooth/dental problems, visual complaints, visual changes, ear aches. Neuro: difficulty with speech, weakness, numbness, ataxia. CV:  chest pain, orthopnea, PND, swelling in lower extremities, dizziness, palpitations, syncope.  Resp: cough, hemoptysis, dyspnea, wheezing. GI  heartburn, indigestion, abdominal pain, nausea, vomiting, diarrhea, constipation, change in bowel habits, loss of appetite, hematemesis, melena, hematochezia.  GU: dysuria, change in color of urine, urgency or frequency, flank pain, hematuria. MSK: joint pain or swelling, decreased range of motion. Psych: change in mood or affect, depression, anxiety, suicidal ideations, homicidal ideations. Skin: rash, itching, bruising.   SUBJECTIVE:  Denies chest pain, SOB, N/V/D, myalgias.  Still has abdominal pain, primarily epigastric region but tender throughout.  VITAL SIGNS: Temp:  [98.1 F (36.7 C)-102.3 F (39.1 C)] 102.3 F (39.1 C) (11/16 1421) Pulse Rate:  [112-151] 151 (11/16 1425) Resp:  [18-26] 26 (11/16 1425) BP: (121-147)/(89-104) 121/89 mmHg (11/16 1425) SpO2:  [86 %-100 %] 86 % (11/16 1425) Weight:  [106.595 kg (235 lb)-111.5 kg (245 lb 13 oz)] 111.5 kg (245 lb 13 oz) (11/16 1425)  PHYSICAL EXAMINATION: General: Adult AA male, resting in bed, in NAD. Neuro: A&O x 3, non-focal.  HEENT: [place]/AT. PERRL, sclerae anicteric. Cardiovascular: Tachy, regular, no M/R/G.  Lungs: Respirations  even and unlabored.  CTA bilaterally, No W/R/R. Abdomen: BS hypoactive.  Mild distention with tenderness throughout.  Mild guarding.  No rebound. Musculoskeletal: No gross deformities,  no edema.  Skin: Intact, warm, no rashes.    Recent Labs Lab 10/21/15 1300 10/22/15 0358  NA 135 136  K 3.2* 3.6  CL 97* 101  CO2 27 25  BUN 11 17  CREATININE 1.67* 3.20*  GLUCOSE 162* 113*    Recent Labs Lab 10/21/15 1300 10/22/15 0358  HGB 14.0 13.0  HCT 40.6 36.9*  WBC 8.9 9.1  PLT 186 80*   Ct Abdomen Pelvis W Contrast  10/21/2015  CLINICAL DATA:  Upper abdominal pain for 1 day. History of alcoholism. EXAM: CT ABDOMEN AND PELVIS WITH CONTRAST TECHNIQUE: Multidetector CT imaging of the abdomen and pelvis was performed using the standard protocol following bolus administration of intravenous contrast. CONTRAST:  80mL OMNIPAQUE IOHEXOL 300 MG/ML  SOLN COMPARISON:  None. FINDINGS: Lower chest: No significant pulmonary nodules or acute consolidative airspace disease. Hepatobiliary: Diffuse hepatic steatosis. Simple 1.3 cm right liver lobe cyst. No additional liver lesions. No gross liver surface irregularity. Normal gallbladder with no radiopaque cholelithiasis. No biliary ductal dilatation. Pancreas: There is diffuse thickening of the entire pancreas with prominent peripancreatic fat stranding and ill-defined fluid along the entire length of the pancreas, in keeping with acute pancreatitis. There is a nonspecific 1.0 x 0.7 cm cystic focus in the pancreatic body (series 2/image 35). No main pancreatic duct dilation. No additional focal pancreatic lesions. No pancreatic gas or areas of pancreatic parenchymal hypoenhancement. There is extension of inflammatory fluid into the left greater than right bilateral anterior para renal retroperitoneal space. There is extension of inflammatory fluid into the central mesentery and gastrocolic ligament, with a 7.2 x 2.5 x 3.9 cm left gastrocolic ligament fluid collection. Spleen: Normal size. No mass. Adrenals/Urinary Tract: Normal adrenals. Normal kidneys with no hydronephrosis and no renal mass. Normal bladder. Stomach/Bowel: Grossly normal stomach.  Normal caliber small bowel with no small bowel wall thickening. Normal appendix. Normal large bowel with no diverticulosis, large bowel wall thickening or pericolonic fat stranding. Vascular/Lymphatic: Normal caliber abdominal aorta. Patent portal, splenic, hepatic and renal veins. No pathologically enlarged lymph nodes in the abdomen or pelvis. Reproductive: Normal size prostate. Other: No pneumoperitoneum. Small volume ascites, predominantly perihepatic and deep pelvic. Musculoskeletal: No aggressive appearing focal osseous lesions. Severe degenerative disc disease at L5-S1. Prominent Schmorl's nodes in the superior and inferior endplates of the L4 vertebral body. Mild degenerative changes throughout the remaining visualized thoracolumbar spine. Mild gynecomastia, slightly asymmetric to the left. Small fat containing umbilical hernia. Suggestion of a small fat containing left inguinal hernia. IMPRESSION: 1. Acute pancreatitis. No evidence of necrotizing pancreatitis. No vascular complication. 2. Nonspecific 1.0 cm cystic focus in the pancreatic body, which could represent a developing pseudocyst. Recommend attention on follow-up MRI abdomen with and without intravenous contrast in 3 months. 3. Prominent inflammatory fluid extending into the bilateral retroperitoneum and gastrocolic ligament, with a developing left gastrocolic ligament fluid collection as described. 4. Small volume ascites. 5. Diffuse hepatic steatosis. Electronically Signed   By: Delbert PhenixJason A Poff M.D.   On: 10/21/2015 15:22   [place] Abdomen Limited Ruq  10/22/2015  CLINICAL DATA:  Acute pancreatitis EXAM: US ABDOMEN LIMITED - RIGHT UPPER QUADRANT COMPARISON:  11/15/ 16 CT scan FINDINGS: Gallbladder: No gallstones are noted within gallbladder. There is thickening of gallbladder wall up to 8 mm. No sonographic Murphy's sign. Common bile duct: Diameter: 4.5 mm  in diameter within normal limits. Liver: Small perihepatic ascites. There is diffuse increased  echogenicity of the liver suspicious for fatty infiltration. IMPRESSION: 1. No shadowing gallstones are noted within gallbladder. Thickening of gallbladder wall up to 8 mm. No sonographic Murphy's sign. Normal CBD. Small perihepatic ascites. Fatty infiltration of the liver. Electronically Signed   By: Natasha Mead M.D.   On: 10/22/2015 12:20    ASSESSMENT / PLAN:  SIRS with concern for occult sepsis - ? Pancreatic pseudocysts vs necrotizing pancreatitis. Plan: Empiric vanc / primaxin. Obtain blood cultures. PCT algorithm to limit abx exposure. Assess lactic acid.  Acute alcoholic pancreatitis. Plan: Continue NPO. Continue aggressive hydration. Follow lipase, LFT's.  ETOH abuse. Plan: Continue CIWA protocol. Thiamine / folate / multivitamin. ETOH counseling.  Acute hypoxic respiratory failure - likely anxiety + atelectasis.  Right pleural effusion minimal on CXR; doubt that this is significant contributor to his hypoxia.  Consider underlying PNA. Atelectasis. Right pleural effusion. Plan: Continue supplemental O2 as needed to maintain SpO2 > 92%. Incentive spirometry. Lasix restricted due to AKI. CXR in AM.  Sinus tachycardia - most likely in the setting of pain, fever, and hypovolemia.  Concern for occult sepsis. Plan: Continue aggressive hydration. Fentanyl PRN. Assess TSH.  AKI - likely hypovolemia + contrast.  Concern for ATN with minimal UOP (less than today). Plan: Recheck BMP. Follow urine sodium / urine creatine to assess FENa. If no improvement and remains oliguric / anuric, may need to involve nephrology.  Thrombocytopenia - likely due to bone marrow suppression from ETOH abuse. Plan: Monitor platelet counts.   Rutherford Guys, [place] [facility] Pager: [phone]  or [phone] 10/22/2015, 3:00 PM

## 2015-10-22 NOTE — Progress Notes (Signed)
ANTIBIOTIC CONSULT NOTE - INITIAL  Pharmacy Consult for vancomycin and Primaxin Indication: rule out sepsis  No Known Allergies  Patient Measurements: Height: 6\' 4"  (193 cm) Weight: 245 lb 13 oz (111.5 kg) IBW/kg (Calculated) : 86.8  Vital Signs: Temp: 102.3 F (39.1 C) (11/16 1421) Temp Source: Oral (11/16 1421) BP: 107/77 mmHg (11/16 1500) Pulse Rate: 138 (11/16 1500) Intake/Output from previous day: 11/15 0701 - 11/16 0700 In: 4322.1 [P.O.:120; I.V.:2152.1; IV Piggyback:2050] Out: 450 [Urine:450] Intake/Output from this shift: Total I/O In: 1062.5 [I.V.:562.5; IV Piggyback:500] Out: 100 [Urine:100]  Labs:  Recent Labs  10/21/15 1300 10/22/15 0358  WBC 8.9 9.1  HGB 14.0 13.0  PLT 186 80*  CREATININE 1.67* 3.20*   Estimated Creatinine Clearance: 42.8 mL/min (by C-G formula based on Cr of 3.2). No results for input(s): VANCOTROUGH, VANCOPEAK, VANCORANDOM, GENTTROUGH, GENTPEAK, GENTRANDOM, TOBRATROUGH, TOBRAPEAK, TOBRARND, AMIKACINPEAK, AMIKACINTROU, AMIKACIN in the last 72 hours.   Microbiology: No results found for this or any previous visit (from the past 720 hour(s)).  Assessment: Larry Maldonado YOM with history of alcohol abuse who presented with epigastric pain with nausea. SIRS with concern for occult sepsis- pancreatice pseudocysts vs necrotizing pancreatitis. WBC normal, tmax up to 102.3.  Blood and urine cultures have been ordered.  SCr up to 3.2, eCrCl ~40-1245mL/min currently using adjusted BW, normalized CrCL ~30-2235mL/min.  Goal of Therapy:  Vancomycin trough level 15-20 mcg/ml  Plan:  -Primaxin 500mg  IV q8h -vancomycin load with 2000mg  IV x1, then 1250mg  IV q24h based on obesity nomogram -follow c/s, clinical progression, renal function  Shaina Gullatt D. Odes Lolli, PharmD, BCPS Clinical Pharmacist Pager: [phone] 10/22/2015 3:51 PM

## 2015-10-22 NOTE — Progress Notes (Addendum)
[facility] PROGRESS NOTE  Deon Duer WUJ:[id number] DOB: 27-Sep-1976 DOA: 10/21/2015  PCP: No PCP Per Patient  Brief HPI: 39 year old African-American male with no significant past medical history presented with epigastric abdominal pain with nausea. Patient consumes significant quantities of alcohol on daily basis including beer and Vodka. He was found to have acute pancreatitis. He was hospitalized for further management.  Past medical history:  Past Medical History  Diagnosis Date  . Hypertension   . Chronic lower back pain   . Acute alcoholic pancreatitis 10/21/2015  . Alcohol related seizure (HCC) 04/2015    [name] 10/21/2015    Consultants: none  Procedures: none  Antibiotics: none  Subjective: Patient states that he has 8 out of 10 pain in his upper abdomen. Denies any nausea, vomiting. No lightheadedness or dizziness. Does have some tremors. His parents are at bedside.  Objective: Vital Signs  Filed Vitals:   10/21/15 1815 10/21/15 2100 10/22/15 0526 10/22/15 0600  BP: 135/90 147/90  147/99  Pulse: 130 126 145   Temp: 98.7 F (37.1 C) 100.3 F (37.9 C) 99.8 F (37.7 C)   TempSrc: Oral Oral Oral   Resp: Height:  (1.93 m)     Weight: 106.595 kg (235 lb)     SpO2: 100% 97% 92%     Intake/Output Summary (Last 24 hours) at 10/22/15 1013 Last data filed at 10/22/15 0833  Gross per 24 hour  Intake 4113.75 ml  Output    550 ml  Net 3563.75 ml   Filed Weights   10/21/15 1134 10/21/15 1815  Weight: 106.595 kg (235 lb) 106.595 kg (235 lb)    General appearance: alert, cooperative, appears stated age, no distress and tremulous Resp: clear to auscultation bilaterally Cardio: regular rate and rhythm, S1, S2 normal, no murmur, click, rub or gallop GI: abdomen is slightly distended. Tender in the epigastric area without any rebound, rigidity or guarding. No masses or organomegaly. Bowel sounds are sluggish. Extremities: extremities  normal, atraumatic, no cyanosis or edema Neurologic: alert and oriented 3. Tremulous. No focal deficits.  Lab Results:  Basic Metabolic Panel:  Recent Labs Lab 10/21/15 1300 10/22/15 0358  NA 135 136  K 3.2* 3.6  CL 97* 101  CO2 27 25  GLUCOSE 162* 113*  BUN 11 17  CREATININE 1.67* 3.20*  CALCIUM 8.5* 8.2*  MG 1.4*  --    Liver Function Tests:  Recent Labs Lab 10/21/15 1300 10/22/15 0358  AST 77* 56*  ALT 47 36  ALKPHOS 54 43  BILITOT 1.5* 1.6*  PROT 6.9 5.5*  ALBUMIN 3.5 2.5*    Recent Labs Lab 10/21/15 1300  LIPASE 1924*   CBC:  Recent Labs Lab 10/21/15 1300 10/22/15 0358  WBC 8.9 9.1  NEUTROABS 7.3  --   HGB 14.0 13.0  HCT 40.6 36.9*  MCV 106.0* 105.1*  PLT 186 80*     Studies/Results: Ct Abdomen Pelvis W Contrast  10/21/2015  CLINICAL DATA:  Upper abdominal pain for 1 day. History of alcoholism. EXAM: CT ABDOMEN AND PELVIS WITH CONTRAST TECHNIQUE: Multidetector CT imaging of the abdomen and pelvis was performed using the standard protocol following bolus administration of intravenous contrast. CONTRAST:  80mL OMNIPAQUE IOHEXOL 300 MG/ML  SOLN COMPARISON:  None. FINDINGS: Lower chest: No significant pulmonary nodules or acute consolidative airspace disease. Hepatobiliary: Diffuse hepatic steatosis. Simple 1.3 cm right liver lobe cyst. No additional liver lesions. No gross liver surface irregularity. Normal gallbladder with no radiopaque  cholelithiasis. No biliary ductal dilatation. Pancreas: There is diffuse thickening of the entire pancreas with prominent peripancreatic fat stranding and ill-defined fluid along the entire length of the pancreas, in keeping with acute pancreatitis. There is a nonspecific 1.0 x 0.7 cm cystic focus in the pancreatic body (series 2/image 35). No main pancreatic duct dilation. No additional focal pancreatic lesions. No pancreatic gas or areas of pancreatic parenchymal hypoenhancement. There is extension of inflammatory fluid  into the left greater than right bilateral anterior para renal retroperitoneal space. There is extension of inflammatory fluid into the central mesentery and gastrocolic ligament, with a 7.2 x 2.5 x 3.9 cm left gastrocolic ligament fluid collection. Spleen: Normal size. No mass. Adrenals/Urinary Tract: Normal adrenals. Normal kidneys with no hydronephrosis and no renal mass. Normal bladder. Stomach/Bowel: Grossly normal stomach. Normal caliber small bowel with no small bowel wall thickening. Normal appendix. Normal large bowel with no diverticulosis, large bowel wall thickening or pericolonic fat stranding. Vascular/Lymphatic: Normal caliber abdominal aorta. Patent portal, splenic, hepatic and renal veins. No pathologically enlarged lymph nodes in the abdomen or pelvis. Reproductive: Normal size prostate. Other: No pneumoperitoneum. Small volume ascites, predominantly perihepatic and deep pelvic. Musculoskeletal: No aggressive appearing focal osseous lesions. Severe degenerative disc disease at L5-S1. Prominent Schmorl's nodes in the superior and inferior endplates of the L4 vertebral body. Mild degenerative changes throughout the remaining visualized thoracolumbar spine. Mild gynecomastia, slightly asymmetric to the left. Small fat containing umbilical hernia. Suggestion of a small fat containing left inguinal hernia. IMPRESSION: 1. Acute pancreatitis. No evidence of necrotizing pancreatitis. No vascular complication. 2. Nonspecific 1.0 cm cystic focus in the pancreatic body, which could represent a developing pseudocyst. Recommend attention on follow-up MRI abdomen with and without intravenous contrast in 3 months. 3. Prominent inflammatory fluid extending into the bilateral retroperitoneum and gastrocolic ligament, with a developing left gastrocolic ligament fluid collection as described. 4. Small volume ascites. 5. Diffuse hepatic steatosis. Electronically Signed   By: Delbert Phenix M.D.   On: 10/21/2015 15:22     Medications:  Scheduled: . folic acid  1 mg Oral Daily  . heparin  5,000 Units Subcutaneous 3 times per day  . LORazepam  0-4 mg Intravenous Q6H   Followed by  . [START ON 10/24/2015] LORazepam  0-4 mg Intravenous Q12H  . multivitamin with minerals  1 tablet Oral Daily  . sodium chloride  3 mL Intravenous Q12H  . thiamine  100 mg Oral Daily   Or  . thiamine  100 mg Intravenous Daily   Continuous: . sodium chloride 125 mL/hr at 10/22/15 1228   ZOX:[id number] **OR** LORazepam, morphine injection  Assessment/Plan:  Principal Problem:   Acute alcoholic pancreatitis Active Problems:   ETOH abuse    Acute alcoholic pancreatitis Patient noted to be very tachycardic. Blood pressure stable. Continue with aggressive IV hydration. Lipase is noted to be improved this morning. LFTs are stable. Ultrasound does not show any evidence for choledocholithiasis. Patient remains at high risk for decompensation. Transfer to stepdown unit for closer monitoring.  Acute renal failure Creatinine has risen significantly compared to yesterday. This could be due to hypovolemia, but also could be due to contrast-induced nephropathy. Monitor urine output. Check UA. Recheck renal function tomorrow. May need to consult nephrology if there is no improvement.  Sinus tachycardia Probably related to acute illness/pain/alcohol withdrawal. Check TSH. Check EKG. Patient denies any chest pain or shortness of breath. His pain is primarily in the epigastric area.  Alcohol abuse with withdrawal symptoms Continue  CIWA protocol. Thiamine, folic acid and multivitamins. 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 Close monitoring.  Thrombocytopenia Likely due to alcohol. Continue to monitor counts. No evidence for overt bleeding.  ADDENDUM Patient's temp upto 102. He was also hypoxic. Patient started on Imipenem. CXR, ABG, Blood cs and lactic acid pending. Discussed with Dr. Jamison NeighborNestor with PCCM.   They will evaluate patient.   DVT Prophylaxis: subcutaneous heparin will be stopped due to low platelets. SCD's Code Status: Full Code  Family Communication: discussed with the patient and his parents, with his permission  Disposition Plan: transfer to stepdown for closer monitoring.    LOS: 1 day   Mesquite Rehabilitation HospitalKRISHNAN,Moustapha Tooker  [facility] Pager [phone] 10/22/2015, 10:13 AM  If 7PM-7AM, please contact night-coverage at [email or web address], password [email or web address]

## 2015-10-22 NOTE — Progress Notes (Signed)
Notified MD of pt's HR still ST 140's, with RR 30-40's. ABG and cooling blanket ordered. Will continue to monitor.

## 2015-10-22 NOTE — Progress Notes (Signed)
HR ST 140's, Tmax 103, pt complaining of abdominal tenderness.  MD notified. Rapid response called. No new orders received. Will continue to monitor.

## 2015-10-22 NOTE — Progress Notes (Signed)
Pt arrived to [address] [facility], HR ST 150-170's pt is tachypneic with RR 40-50's, BP  110's/70, tmax 102.3. MD paged and notified. Critical care consulted. STAT ekg completed, 500cc bolus ordered. Tylenol given. Will continue to monitor.

## 2015-10-23 ENCOUNTER — Inpatient Hospital Stay (HOSPITAL_COMMUNITY): Payer: Self-pay

## 2015-10-23 DIAGNOSIS — F10239 Alcohol dependence with withdrawal, unspecified: Secondary | ICD-10-CM

## 2015-10-23 DIAGNOSIS — R509 Fever, unspecified: Secondary | ICD-10-CM | POA: Insufficient documentation

## 2015-10-23 LAB — COMPREHENSIVE METABOLIC PANEL
ALBUMIN: 2 g/dL — AB (ref 3.5–5.0)
ALK PHOS: 44 U/L (ref 38–126)
ALT: 25 U/L (ref 17–63)
ALT: 21 U/L (ref 17–63)
AST: 44 U/L — AB (ref 15–41)
AST: 39 U/L (ref 15–41)
Albumin: 2.2 g/dL — ABNORMAL LOW (ref 3.5–5.0)
Alkaline Phosphatase: 58 U/L (ref 38–126)
Anion gap: 12 (ref 5–15)
Anion gap: 11 (ref 5–15)
BUN: 32 mg/dL — AB (ref 6–20)
BUN: 37 mg/dL — AB (ref 6–20)
CALCIUM: 6.7 mg/dL — AB (ref 8.9–10.3)
CHLORIDE: 108 mmol/L (ref 101–111)
CHLORIDE: 110 mmol/L (ref 101–111)
CO2: 20 mmol/L — AB (ref 22–32)
CO2: 18 mmol/L — AB (ref 22–32)
CREATININE: 4.21 mg/dL — AB (ref 0.61–1.24)
CREATININE: 4.38 mg/dL — AB (ref 0.61–1.24)
Calcium: 7.1 mg/dL — ABNORMAL LOW (ref 8.9–10.3)
GFR calc Af Amer: 19 mL/min — ABNORMAL LOW (ref >60)
GFR calc non Af Amer: 17 mL/min — ABNORMAL LOW (ref >60)
GFR calc non Af Amer: 16 mL/min — ABNORMAL LOW (ref >60)
GFR, EST AFRICAN AMERICAN: 18 mL/min — AB (ref >60)
GLUCOSE: 93 mg/dL (ref 65–99)
Glucose, Bld: 82 mg/dL (ref 65–99)
Potassium: 4 mmol/L (ref 3.5–5.1)
Potassium: 3.9 mmol/L (ref 3.5–5.1)
SODIUM: 140 mmol/L (ref 135–145)
SODIUM: 139 mmol/L (ref 135–145)
Total Bilirubin: 1.5 mg/dL — ABNORMAL HIGH (ref 0.3–1.2)
Total Bilirubin: 1.4 mg/dL — ABNORMAL HIGH (ref 0.3–1.2)
Total Protein: 5.6 g/dL — ABNORMAL LOW (ref 6.5–8.1)
Total Protein: 5.1 g/dL — ABNORMAL LOW (ref 6.5–8.1)

## 2015-10-23 LAB — URINE CULTURE: Culture: 1000

## 2015-10-23 LAB — CBC
HCT: 37.8 % — ABNORMAL LOW (ref 39.0–52.0)
HEMOGLOBIN: 13.1 g/dL (ref 13.0–17.0)
MCH: 36.5 pg — ABNORMAL HIGH (ref 26.0–34.0)
MCHC: 34.7 g/dL (ref 30.0–36.0)
MCV: 105.3 fL — AB (ref 78.0–100.0)
PLATELETS: 54 10*3/uL — AB (ref 150–400)
RBC: 3.59 MIL/uL — AB (ref 4.22–5.81)
RDW: 15.8 % — ABNORMAL HIGH (ref 11.5–15.5)
WBC: 9 10*3/uL (ref 4.0–10.5)

## 2015-10-23 LAB — LIPASE, BLOOD: Lipase: 149 U/L — ABNORMAL HIGH (ref 11–51)

## 2015-10-23 LAB — LACTIC ACID, PLASMA: Lactic Acid, Venous: 1.8 mmol/L (ref 0.5–2.0)

## 2015-10-23 LAB — CK: Total CK: 107 U/L (ref 49–397)

## 2015-10-23 LAB — MRSA PCR SCREENING: MRSA by PCR: NEGATIVE

## 2015-10-23 MED ORDER — FENTANYL 40 MCG/ML IV SOLN
INTRAVENOUS | Status: DC
  Administered 2015-10-23: 105 ug via INTRAVENOUS
  Administered 2015-10-23: 45 ug via INTRAVENOUS
  Administered 2015-10-23: 13:00:00 via INTRAVENOUS
  Administered 2015-10-24: 315 ug via INTRAVENOUS
  Filled 2015-10-23: qty 25

## 2015-10-23 MED ORDER — FUROSEMIDE 10 MG/ML IJ SOLN
160.0000 mg | Freq: Three times a day (TID) | INTRAVENOUS | Status: DC
  Administered 2015-10-23 – 2015-10-25 (×6): 160 mg via INTRAVENOUS
  Filled 2015-10-23 (×9): qty 16

## 2015-10-23 MED ORDER — SODIUM CHLORIDE 0.9 % IJ SOLN
9.0000 mL | INTRAMUSCULAR | Status: DC | PRN
Start: 2015-10-23 — End: 2015-10-24

## 2015-10-23 MED ORDER — SODIUM CHLORIDE 0.9 % IV SOLN
250.0000 mg | Freq: Four times a day (QID) | INTRAVENOUS | Status: DC
  Administered 2015-10-23 – 2015-10-28 (×20): 250 mg via INTRAVENOUS
  Filled 2015-10-23 (×24): qty 250

## 2015-10-23 MED ORDER — DIPHENHYDRAMINE HCL 50 MG/ML IJ SOLN
12.5000 mg | Freq: Four times a day (QID) | INTRAMUSCULAR | Status: DC | PRN
  Filled 2015-10-23: qty 1

## 2015-10-23 MED ORDER — ONDANSETRON HCL 4 MG/2ML IJ SOLN: 4.0000 mg | Freq: Four times a day (QID) | INTRAMUSCULAR | Status: DC | PRN

## 2015-10-23 MED ORDER — NALOXONE HCL 0.4 MG/ML IJ SOLN: 0.4000 mg | INTRAMUSCULAR | Status: DC | PRN

## 2015-10-23 MED ORDER — DIPHENHYDRAMINE HCL 12.5 MG/5ML PO ELIX: 12.5000 mg | ORAL_SOLUTION | Freq: Four times a day (QID) | ORAL | Status: DC | PRN

## 2015-10-23 NOTE — Progress Notes (Signed)
Name: Larry Maldonado MRN: [id number] DOB: 1975-12-31    ADMISSION DATE:  10/21/2015 CONSULTATION DATE:  10/21/15  REFERRING MD :  Larry Maldonado  CHIEF COMPLAINT:  Abd pain  BRIEF PATIENT DESCRIPTION: 39 y.o. male  admitted to [facility] 11/15 with ETOH pancreatitis.  On 11/16, developed fever and sinus tach; therefore,  PCCM called for further recs.  STUDIES: CT A / P 11/15 >>> acute pancreatitis without evidence of necrotizing pancreatitis.  Nonspecific 1.0cm cyst in pancreatic body which could represent developing pseudocyst.  Inflammatory fluid in retroperitoneum.  Small ascites with diffuse hepatic steatosis. RUQ [place] 11/15 >>> no gallstones.  GB thickening up to 8mm.  SIGNIFICANT EVENTS:  11/15 - admitted with ETOH pancreatitis. 11/16 - PCCM consult for fever and tachycardia.   HISTORY OF PRESENT ILLNESS:  Larry Maldonado is a 39 y.o. male with a PMH as outlined below.  He presented to [facility] ED 11/15 with epigastric abdominal pain that began 1 night prior.  He had some mild nausea but never vomited.  Pain got too unbearable therefore he came to ED for further evaluation. 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  Workup was suggestive of alcoholic pancreatitis.  He was admitted by [facility] for further management.  On 11/16, he developed fever (Tmax 102) and tachycardia.  PCCM was consulted for further recs.      SUBJECTIVE:  Denies chest pain, SOB, N/V/D, myalgias.  Still has abdominal pain, primarily epigastric region but tender throughout.  VITAL SIGNS: Temp:  [98.6 F (37 C)-103.2 F (39.6 C)] 99.5 F (37.5 C) (11/17 0814) Pulse Rate:  [112-151] 116 (11/17 0800) Resp:  [25-50] 50 (11/17 0800) BP: (106-134)/(70-91) 124/81 mmHg (11/17 0800) SpO2:  [86 %-100 %] 100 % (11/17 0800) Weight:  [245 lb 13 oz (111.5 kg)] 245 lb 13 oz (111.5 kg) (11/16 1425)  PHYSICAL EXAMINATION: General: Adult AA male, resting in  bed, in NAD. Neuro: A&O x 3, non-focal.  HEENT: [place]/AT. PERRL, sclerae anicteric. Cardiovascular: Tachy, regular, no M/R/G.  Lungs: Respirations even and unlabored.  Decreased bs bases, tachypnea  Abdomen: BS hypoactive.   distention with tenderness throughout.  Mild guarding.   Musculoskeletal: No gross deformities, no edema.  Skin: Intact, warm, no rashes.    Recent Labs Lab 10/21/15 1300 10/22/15 0358 10/22/15 1500  NA 135 136 136  K 3.2* 3.6 4.2  CL 97* 101 106  CO2 BUN 11 17 25*  CREATININE 1.67* 3.20* 3.59*  GLUCOSE 162* 113* 104*    Recent Labs Lab 10/21/15 1300 10/22/15 0358 10/22/15 1829 10/23/15 0542  HGB 14.0 13.0  --  13.1  HCT 40.6 36.9*  --  37.8*  WBC 8.9 9.1  --  9.0  PLT 186 80* 72* PENDING   Ct Abdomen Pelvis W Contrast  10/21/2015  CLINICAL DATA:  Upper abdominal pain for 1 day. History of alcoholism. EXAM: CT ABDOMEN AND PELVIS WITH CONTRAST TECHNIQUE: Multidetector CT imaging of the abdomen and pelvis was performed using the standard protocol following bolus administration of intravenous contrast. CONTRAST:  80mL OMNIPAQUE IOHEXOL 300 MG/ML  SOLN COMPARISON:  None. FINDINGS: Lower chest: No significant pulmonary nodules or acute consolidative airspace disease. Hepatobiliary: Diffuse hepatic steatosis. Simple 1.3 cm right liver lobe cyst. No additional liver lesions. No gross liver surface irregularity. Normal gallbladder with no radiopaque cholelithiasis. No biliary ductal dilatation. Pancreas: There is diffuse thickening of the entire  pancreas with prominent peripancreatic fat stranding and ill-defined fluid along the entire length of the pancreas, in keeping with acute pancreatitis. There is a nonspecific 1.0 x 0.7 cm cystic focus in the pancreatic body (series 2/image 35). No main pancreatic duct dilation. No additional focal pancreatic lesions. No pancreatic gas or areas of pancreatic parenchymal hypoenhancement. There is extension of  inflammatory fluid into the left greater than right bilateral anterior para renal retroperitoneal space. There is extension of inflammatory fluid into the central mesentery and gastrocolic ligament, with a 7.2 x 2.5 x 3.9 cm left gastrocolic ligament fluid collection. Spleen: Normal size. No mass. Adrenals/Urinary Tract: Normal adrenals. Normal kidneys with no hydronephrosis and no renal mass. Normal bladder. Stomach/Bowel: Grossly normal stomach. Normal caliber small bowel with no small bowel wall thickening. Normal appendix. Normal large bowel with no diverticulosis, large bowel wall thickening or pericolonic fat stranding. Vascular/Lymphatic: Normal caliber abdominal aorta. Patent portal, splenic, hepatic and renal veins. No pathologically enlarged lymph nodes in the abdomen or pelvis. Reproductive: Normal size prostate. Other: No pneumoperitoneum. Small volume ascites, predominantly perihepatic and deep pelvic. Musculoskeletal: No aggressive appearing focal osseous lesions. Severe degenerative disc disease at L5-S1. Prominent Schmorl's nodes in the superior and inferior endplates of the L4 vertebral body. Mild degenerative changes throughout the remaining visualized thoracolumbar spine. Mild gynecomastia, slightly asymmetric to the left. Small fat containing umbilical hernia. Suggestion of a small fat containing left inguinal hernia. IMPRESSION: 1. Acute pancreatitis. No evidence of necrotizing pancreatitis. No vascular complication. 2. Nonspecific 1.0 cm cystic focus in the pancreatic body, which could represent a developing pseudocyst. Recommend attention on follow-up MRI abdomen with and without intravenous contrast in 3 months. 3. Prominent inflammatory fluid extending into the bilateral retroperitoneum and gastrocolic ligament, with a developing left gastrocolic ligament fluid collection as described. 4. Small volume ascites. 5. Diffuse hepatic steatosis. Electronically Signed   By: Delbert Phenix M.D.   On:  10/21/2015 15:22   Dg Chest Port 1 View  10/23/2015  CLINICAL DATA:  Respiratory failure.  Acute pancreatitis. EXAM: PORTABLE CHEST 1 VIEW COMPARISON:  10/22/2015 FINDINGS: The heart is enlarged but stable. Persistent low lung volumes with bibasilar atelectasis and probable small effusions. IMPRESSION: Persistent low lung volumes with bibasilar atelectasis and probable small effusions. Electronically Signed   By: Rudie Meyer M.D.   On: 10/23/2015 09:08   Dg Chest Port 1 View  10/22/2015  CLINICAL DATA:  Hypoxemia EXAM: PORTABLE CHEST - 1 VIEW COMPARISON:  04/20/2015 FINDINGS: Poor inspiratory effort is noted. The cardiac shadow is accentuated by poor inspiratory effort. Right basilar atelectatic changes are noted with blunting of the costophrenic angle. This may represent a small effusion. Crowding of the vascular markings is seen. IMPRESSION: Right basilar atelectasis with likely small effusion present. Electronically Signed   By: Alcide Clever M.D.   On: 10/22/2015 15:02   US Abdomen Limited Ruq  10/22/2015  CLINICAL DATA:  Acute pancreatitis EXAM: US ABDOMEN LIMITED - RIGHT UPPER QUADRANT COMPARISON:  11/15/ 16 CT scan FINDINGS: Gallbladder: No gallstones are noted within gallbladder. There is thickening of gallbladder wall up to 8 mm. No sonographic Murphy's sign. Common bile duct: Diameter: 4.5 mm in diameter within normal limits. Liver: Small perihepatic ascites. There is diffuse increased echogenicity of the liver suspicious for fatty infiltration. IMPRESSION: 1. No shadowing gallstones are noted within gallbladder. Thickening of gallbladder wall up to 8 mm. No sonographic Murphy's sign. Normal CBD. Small perihepatic ascites. Fatty infiltration of the liver. Electronically Signed  By: Natasha MeadLiviu  Pop M.D.   On: 10/22/2015 12:20    ASSESSMENT / PLAN:  SIRS with concern for occult sepsis - ? Pancreatic pseudocysts vs necrotizing pancreatitis. Febrile despite abx Plan: Empiric vanc /  primaxin. Obtain blood cultures. PCT 6.94 Follow serial  lactic acids  Acute alcoholic pancreatitis. Plan: Continue NPO. Continue aggressive hydration. Follow lipase, LFT's. Trending down  ETOH abuse. Plan: Continue CIWA protocol. Thiamine / folate / multivitamin. ETOH counseling.  Acute hypoxic respiratory failure - likely anxiety + atelectasis.  Right pleural effusion minimal on CXR; doubt that this is significant contributor to his hypoxia.  Consider underlying PNA. Atelectasis. Right pleural effusion. Plan: Continue supplemental O2 as needed to maintain SpO2 > 92%. Incentive spirometry. Lasix restricted due to AKI. CXR as needed  Sinus tachycardia - most likely in the setting of pain, fever, and hypovolemia.  Concern for occult sepsis. Plan: Continue aggressive hydration. Fentanyl PRN. TSH nl  AKI - likely hypovolemia + contrast.  Concern for ATN with minimal UOP. Lab Results  Component Value Date   CREATININE 3.59* 10/22/2015   CREATININE 3.20* 10/22/2015   CREATININE 1.67* 10/21/2015    Plan: Follow  BMP. Follow urine sodium / urine creatine to assess FENa. Suggest involve nephrology. 11/17 order renal ultrasound , CT abd did no include pelvis  Thrombocytopenia - likely due to bone marrow suppression from ETOH abuse. Plan: Monitor platelet counts.  Summary:  Increased wob, febrile, tachycardia, worsening renal failure, he will need close monitoring and will recheck lactic acid. Continues to be febrile despite abx.   Larry CanalesSteve Larry Maldonado ACNP Larry PollackLe Maldonado PCCM Pager [phone] till 3 pm If no answer page [phone] 10/23/2015, 9:52 AM

## 2015-10-23 NOTE — Progress Notes (Signed)
STAFF NOTE: I, Dr Lavinia SharpsM Jermani Eberlein have personally reviewed patient's available data, including medical history, events of note, physical examination and test results as part of my evaluation. I have discussed with resident/NP and other care providers such as pharmacist, RN and RRT.  In addition,  I personally evaluated patient and elicited key findings of   S: cotninued abd pain and tachypnea due to spplinting. RN driven prn fentanyl not helping. HAs dyspnea. On [place] o2  O: Monitor shows RR 50 but clincially looks ok and RR is 30 AxOX3 Abd distended and tender   Recent Labs Lab 10/21/15 1300 10/22/15 0358 10/22/15 1829 10/23/15 0542  HGB 14.0 13.0  --  13.1  HCT 40.6 36.9*  --  37.8*  WBC 8.9 9.1  --  9.0  PLT 186 80* 72* PENDING    Recent Labs Lab 10/21/15 1300 10/22/15 0358 10/22/15 1500 10/23/15 0542  NA 135 136 136 140  K 3.2* 3.6 4.2 4.0  CL 97* 101 106 108  CO2 27 25 22  20*  GLUCOSE 162* 113* 104* 82  BUN 11 17 25* 32*  CREATININE 1.67* 3.20* 3.59* 4.21*  CALCIUM 8.5* 8.2* 7.4* 7.1*  MG 1.4*  --   --   --     Recent Labs Lab 10/22/15 1500 10/22/15 1535  LATICACIDVEN  --  1.7  PROCALCITON 6.94  --    CXR - low lung volume wit atlectasis - persnally visualized  A: Acute renal failure - getting worse - d/w Dr Barnie DelG Krishnan of [facility] - he has called renal. I wil dc vanc Acute pancreatitis - dc vanc. Continue imipenem Severe  Pain due to acute pancreatitis - change RN directed fent to high dose PCA = risks, benefit and limitatinos explained to patients SIRS with acute hypoxemic resp failure- continue to monitor - if not improved - move to ICU     Dr. Kalman ShanMurali Trevontae Lindahl, M.D., [facility] HealthF.C.C.P Pulmonary and Critical Care Medicine Staff Physician [facility] Pager: [phone], If no answer or between  15:00h - 7:00h: call [phone]  10/23/2015 11:32 AM

## 2015-10-23 NOTE — Progress Notes (Signed)
ANTIBIOTIC CONSULT NOTE  Pharmacy Consult for vancomycin and Primaxin Indication: rule out sepsis  No Known Allergies  Patient Measurements: Height: 6\' 4"  (193 cm) Weight: 245 lb 13 oz (111.5 kg) IBW/kg (Calculated) : 86.8  Vital Signs: Temp: 99.5 F (37.5 C) (11/17 0814) Temp Source: Oral (11/17 0814) BP: 124/81 mmHg (11/17 0800) Pulse Rate: 116 (11/17 0800) Intake/Output from previous day: 11/16 0701 - 11/17 0700 In: 3287.5 [I.V.:2687.5; IV Piggyback:600] Out: 500 [Urine:500] Intake/Output from this shift: Total I/O In: -  Out: 400 [Urine:400]  Labs:  Recent Labs  10/21/15 1300 10/22/15 0358 10/22/15 1500 10/22/15 1725 10/22/15 1829 10/23/15 0542  WBC 8.9 9.1  --   --   --  9.0  HGB 14.0 13.0  --   --   --  13.1  PLT 186 80*  --   --  72* PENDING  LABCREA  --   --   --  284.68  --   --   CREATININE 1.67* 3.20* 3.59*  --   --  4.21*   Estimated Creatinine Clearance: 32.5 mL/min (by C-G formula based on Cr of 4.21). No results for input(s): VANCOTROUGH, VANCOPEAK, VANCORANDOM, GENTTROUGH, GENTPEAK, GENTRANDOM, TOBRATROUGH, TOBRAPEAK, TOBRARND, AMIKACINPEAK, AMIKACINTROU, AMIKACIN in the last 72 hours.   Microbiology: Recent Results (from the past 720 hour(s))  MRSA PCR Screening     Status: None   Collection Time: 10/22/15 10:21 PM  Result Value Ref Range Status   MRSA by PCR NEGATIVE NEGATIVE Final    Comment:        The GeneXpert MRSA Assay (FDA approved for NASAL specimens only), is one component of a comprehensive MRSA colonization surveillance program. It is not intended to diagnose MRSA infection nor to guide or monitor treatment for MRSA infections.     Assessment: Larry Maldonado YOM with history of alcohol abuse who presented with epigastric pain with nausea. SIRS with concern for occult sepsis- pancreatic pseudocysts vs necrotizing pancreatitis. WBC normal, tmax up to 103.2- remains febrile despite antibiotics  11/16 BCx: sent 11/16 urine:  sent  SCr up to 4.21, eCrCl ~30-6735mL/min currently using adjusted BW, normalized CrCL ~20-325mL/min.  Goal of Therapy:  Vancomycin trough level 15-20 mcg/ml  Plan:  -change Primaxin to 250mg  IV q6h -continue vancomycin 1250mg  IV q24h- borderline needing to be changed based on obesity nomogram, but will continue this for now -follow c/s, clinical progression, renal function  Billye Nydam D. Rue Valladares, PharmD, BCPS Clinical Pharmacist Pager: [phone] 10/23/2015 10:58 AM

## 2015-10-23 NOTE — Progress Notes (Signed)
[facility] PROGRESS NOTE  Larry SchatzCalvin Maldonado WJX:[id number] DOB: June 13, 1976 DOA: 10/21/2015  PCP: No PCP Per Patient  Brief HPI: 39 year old African-American male with no significant past medical history presented with epigastric abdominal pain with nausea. Patient consumes significant quantities of alcohol on daily basis including beer and Vodka. He was found to have acute pancreatitis. He was hospitalized for further management. Patient was noted to be tachycardic. He was noted also to be going through alcohol withdrawal. He was subsequently transferred to stepdown unit.  Past medical history:  Past Medical History  Diagnosis Date  . Hypertension   . Chronic lower back pain   . Acute alcoholic pancreatitis 10/21/2015  . Alcohol related seizure (HCC) 04/2015    Larry Maldonado/notes 10/21/2015    Consultants: critical care medicine  Procedures: none  Antibiotics: none  Subjective: Patient continues to have severe pain in his upper abdomen. Still 8 out of 10 in intensity. Denies any cough. No chest pains.  Objective: Vital Signs  Filed Vitals:   10/23/15 0800 10/23/15 0811 10/23/15 0814 10/23/15 1115  BP: 124/81     Pulse: 116     Temp:  98.6 F (37 C) 99.5 F (37.5 C) 100.1 F (37.8 C)  TempSrc:  Oral Oral Oral  Resp: 50     Height:      Weight:      SpO2: 100%       Intake/Output Summary (Last 24 hours) at 10/23/15 1213 Last data filed at 10/23/15 1003  Gross per 24 hour  Intake   2225 ml  Output    800 ml  Net   1425 ml   Filed Weights   10/21/15 1134 10/21/15 1815 10/22/15 1425  Weight: 106.595 kg (235 lb) 106.595 kg (235 lb) 111.5 kg (245 lb 13 oz)    General appearance: alert, cooperative, appears stated age, no distress and tremulous Resp: clear to auscultation bilaterally Cardio: S1, S2 is tachycardic, regular. No S3, S4. No rubs, murmurs, or bruit. No real edema. GI: abdomen remains distended. Still quite tender in the epigastric area without any rebound,  rigidity or guarding. No masses or organomegaly. Bowel sounds remain sluggish. Extremities: extremities normal, atraumatic, no cyanosis or edema Neurologic: alert and oriented 3. Tremulous. No focal deficits.  Lab Results:  Basic Metabolic Panel:  Recent Labs Lab 10/21/15 1300 10/22/15 0358 10/22/15 1500 10/23/15 0542  NA 135 136 136 140  K 3.2* 3.6 4.2 4.0  CL 97* 101 106 108  CO2 27 25 22  20*  GLUCOSE 162* 113* 104* 82  BUN 11 17 25* 32*  CREATININE 1.67* 3.20* 3.59* 4.21*  CALCIUM 8.5* 8.2* 7.4* 7.1*  MG 1.4*  --   --   --    Liver Function Tests:  Recent Labs Lab 10/21/15 1300 10/22/15 0358 10/23/15 0542  AST 77* 56* 44*  ALT 47 36 25  ALKPHOS 54 43 58  BILITOT 1.5* 1.6* 1.5*  PROT 6.9 5.5* 5.6*  ALBUMIN 3.5 2.5* 2.2*    Recent Labs Lab 10/21/15 1300 10/22/15 0358 10/23/15 0542  LIPASE 1924* 1145* 149*   CBC:  Recent Labs Lab 10/21/15 1300 10/22/15 0358 10/22/15 1829 10/23/15 0542  WBC 8.9 9.1  --  9.0  NEUTROABS 7.3  --   --   --   HGB 14.0 13.0  --  13.1  HCT 40.6 36.9*  --  37.8*  MCV 106.0* 105.1*  --  105.3*  PLT 186 80* 72* 54*     Studies/Results: Ct Abdomen  Pelvis W Contrast  10/21/2015  CLINICAL DATA:  Upper abdominal pain for 1 day. History of alcoholism. EXAM: CT ABDOMEN AND PELVIS WITH CONTRAST TECHNIQUE: Multidetector CT imaging of the abdomen and pelvis was performed using the standard protocol following bolus administration of intravenous contrast. CONTRAST:  80mL OMNIPAQUE IOHEXOL 300 MG/ML  SOLN COMPARISON:  None. FINDINGS: Lower chest: No significant pulmonary nodules or acute consolidative airspace disease. Hepatobiliary: Diffuse hepatic steatosis. Simple 1.3 cm right liver lobe cyst. No additional liver lesions. No gross liver surface irregularity. Normal gallbladder with no radiopaque cholelithiasis. No biliary ductal dilatation. Pancreas: There is diffuse thickening of the entire pancreas with prominent peripancreatic fat  stranding and ill-defined fluid along the entire length of the pancreas, in keeping with acute pancreatitis. There is a nonspecific 1.0 x 0.7 cm cystic focus in the pancreatic body (series 2/image 35). No main pancreatic duct dilation. No additional focal pancreatic lesions. No pancreatic gas or areas of pancreatic parenchymal hypoenhancement. There is extension of inflammatory fluid into the left greater than right bilateral anterior para renal retroperitoneal space. There is extension of inflammatory fluid into the central mesentery and gastrocolic ligament, with a 7.2 x 2.5 x 3.9 cm left gastrocolic ligament fluid collection. Spleen: Normal size. No mass. Adrenals/Urinary Tract: Normal adrenals. Normal kidneys with no hydronephrosis and no renal mass. Normal bladder. Stomach/Bowel: Grossly normal stomach. Normal caliber small bowel with no small bowel wall thickening. Normal appendix. Normal large bowel with no diverticulosis, large bowel wall thickening or pericolonic fat stranding. Vascular/Lymphatic: Normal caliber abdominal aorta. Patent portal, splenic, hepatic and renal veins. No pathologically enlarged lymph nodes in the abdomen or pelvis. Reproductive: Normal size prostate. Other: No pneumoperitoneum. Small volume ascites, predominantly perihepatic and deep pelvic. Musculoskeletal: No aggressive appearing focal osseous lesions. Severe degenerative disc disease at L5-S1. Prominent Schmorl's nodes in the superior and inferior endplates of the L4 vertebral body. Mild degenerative changes throughout the remaining visualized thoracolumbar spine. Mild gynecomastia, slightly asymmetric to the left. Small fat containing umbilical hernia. Suggestion of a small fat containing left inguinal hernia. IMPRESSION: 1. Acute pancreatitis. No evidence of necrotizing pancreatitis. No vascular complication. 2. Nonspecific 1.0 cm cystic focus in the pancreatic body, which could represent a developing pseudocyst. Recommend  attention on follow-up MRI abdomen with and without intravenous contrast in 3 months. 3. Prominent inflammatory fluid extending into the bilateral retroperitoneum and gastrocolic ligament, with a developing left gastrocolic ligament fluid collection as described. 4. Small volume ascites. 5. Diffuse hepatic steatosis. Electronically Signed   By: Delbert Phenix M.D.   On: 10/21/2015 15:22   Dg Chest Port 1 View  10/23/2015  CLINICAL DATA:  Respiratory failure.  Acute pancreatitis. EXAM: PORTABLE CHEST 1 VIEW COMPARISON:  10/22/2015 FINDINGS: The heart is enlarged but stable. Persistent low lung volumes with bibasilar atelectasis and probable small effusions. IMPRESSION: Persistent low lung volumes with bibasilar atelectasis and probable small effusions. Electronically Signed   By: Rudie Meyer M.D.   On: 10/23/2015 09:08   Dg Chest Port 1 View  10/22/2015  CLINICAL DATA:  Hypoxemia EXAM: PORTABLE CHEST - 1 VIEW COMPARISON:  04/20/2015 FINDINGS: Poor inspiratory effort is noted. The cardiac shadow is accentuated by poor inspiratory effort. Right basilar atelectatic changes are noted with blunting of the costophrenic angle. This may represent a small effusion. Crowding of the vascular markings is seen. IMPRESSION: Right basilar atelectasis with likely small effusion present. Electronically Signed   By: Alcide Clever M.D.   On: 10/22/2015 15:02   US  Abdomen Limited Ruq  10/22/2015  CLINICAL DATA:  Acute pancreatitis EXAM: US ABDOMEN LIMITED - RIGHT UPPER QUADRANT COMPARISON:  11/15/ 16 CT scan FINDINGS: Gallbladder: No gallstones are noted within gallbladder. There is thickening of gallbladder wall up to 8 mm. No sonographic Murphy's sign. Common bile duct: Diameter: 4.5 mm in diameter within normal limits. Liver: Small perihepatic ascites. There is diffuse increased echogenicity of the liver suspicious for fatty infiltration. IMPRESSION: 1. No shadowing gallstones are noted within gallbladder. Thickening of  gallbladder wall up to 8 mm. No sonographic Murphy's sign. Normal CBD. Small perihepatic ascites. Fatty infiltration of the liver. Electronically Signed   By: Natasha Mead M.D.   On: 10/22/2015 12:20    Medications:  Scheduled: . fentaNYL   Intravenous 6 times per day  . folic acid  1 mg Oral Daily  . imipenem-cilastatin  250 mg Intravenous 4 times per day  . LORazepam  0-4 mg Intravenous Q6H   Followed by  . [START ON 10/24/2015] LORazepam  0-4 mg Intravenous Q12H  . multivitamin with minerals  1 tablet Oral Daily  . sodium chloride  3 mL Intravenous Q12H  . thiamine  100 mg Oral Daily   Or  . thiamine  100 mg Intravenous Daily   Continuous: . sodium chloride 125 mL/hr at 10/23/15 0211   WUJ:[id number], diphenhydrAMINE **OR** diphenhydrAMINE, fentaNYL (SUBLIMAZE) injection, LORazepam **OR** LORazepam, naloxone **AND** sodium chloride, ondansetron (ZOFRAN) IV, ondansetron (ZOFRAN) IV  Assessment/Plan:  Principal Problem:   Acute alcoholic pancreatitis Active Problems:   ETOH abuse   Acute renal failure (HCC)   Alcohol withdrawal (HCC)   Thrombocytopenia (HCC)   Respiratory failure (HCC)    Acute alcoholic pancreatitis Patient's status remains tenuous. Discussed with Dr. Marchelle Gearing, with critical care medicine. He plans to initiate fentanyl PCA for better pain control. Agree with this plan. Patient was also started on imipenem yesterday due to fever. He was also started on vancomycin for unclear reasons, which will be discontinued today. Lipase is noted to be improved. Blood pressure stable. The patient remains tachycardic. He remains at high risk for decompensation. Continue to monitor closely. LFTs are stable. Ultrasound does not show any evidence for choledocholithiasis.   Fever Most likely secondary to acute pancreatitis. CT scan did not show any evidence for necrotizing pancreatitis. However, due to his severe  Illness he was started on imipenem. Cultures are pending.  Continue to monitor closely. Cooling blankets as needed.  Acute renal failure Creatinine continues to rise. He is making urine. UA was reviewed. CK will be checked. This could be ATN due to contrast. Discussed with Dr. Hyman Hopes with nephrology. He will evaluate patient later today. Continue IV fluids. Continue to monitor urine output. CT scan of the abdomen did not show any hydronephrosis.  Sinus tachycardia Probably related to acute illness/pain/alcohol withdrawal. TSH is pending. EKG shows sinus tachycardia. Continue to monitor closely.  Alcohol abuse with withdrawal symptoms Patient remains with moderate withdrawal symptoms. Continue CIWA protocol. Thiamine, folic acid and multivitamins. 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 Close monitoring.  Thrombocytopenia Likely due to alcohol. Counts noted to be lower today. No overt bleeding. Continue to monitor.  DVT Prophylaxis: SCD's Code Status: Full Code  Family Communication: discussed with the patient   Disposition Plan: will remain in step down for now.    LOS: 2 days   [facility]  [facility] Pager [phone] 10/23/2015, 12:13 PM  If 7PM-7AM, please contact night-coverage at [email or web address], password [facility]

## 2015-10-23 NOTE — Consult Note (Signed)
Larry Maldonado is an 39 y.o. male referred by Dr Sheran FavaKrishinan   Chief Complaint: AKI HPI: 39yo BM admitted 10/21/15  For abd pain and pancreatitis.  Denies any hx of CKD and says his BP is variable but has never been treated with antiHTN but also has not seen a doctor in a long while.  No baseline Scr but Scr on admission was 1.6.  He received CT with contrast on 11/15.  Since admission Scr has progressively increased to 4.2.  No hypotension.  Says he was eating and drinking fine until few hours PTA.  UO 450cc and 500cc/d.  Says he has urinated 4 x today.  + 6.2L in fluids.  No hx gross hematuria.  Denies use of NSAID's or herbal medicine.  Renal US unremarkable  Past Medical History  Diagnosis Date  . Hypertension   . Chronic lower back pain   . Acute alcoholic pancreatitis 10/21/2015  . Alcohol related seizure (HCC) 04/2015    [name]/notes 10/21/2015    Past Surgical History  Procedure Laterality Date  . Achilles tendon repair Left 2004   [facility] neg for renal ds History reviewed. No pertinent family history. Social History:  reports that he has been smoking Cigars.  He has never used smokeless tobacco. He reports that he drinks about 21.0 oz of alcohol per week. He reports that he does not use illicit drugs.Not married, no kids.  Works at [employer].  Lives alone  Allergies: No Known Allergies  No prescriptions prior to admission     Lab Results: UA: 100 protein.  0-5 rbc, 0-5 wbc.  Large blood   Recent Labs  10/21/15 1300 10/22/15 0358 10/22/15 1829 10/23/15 0542  WBC 8.9 9.1  --  9.0  HGB 14.0 13.0  --  13.1  HCT 40.6 36.9*  --  37.8*  PLT 186 80* 72* 54*   BMET  Recent Labs  10/22/15 0358 10/22/15 1500 10/23/15 0542  NA 136 136 140  K 3.6 4.2 4.0  CL 101 106 108  CO2 25 22 20*  GLUCOSE 113* 104* 82  BUN 17 25* 32*  CREATININE 3.20* 3.59* 4.21*  CALCIUM 8.2* 7.4* 7.1*   LFT  Recent Labs  10/23/15 0542  PROT 5.6*  ALBUMIN 2.2*  AST 44*  ALT 25  ALKPHOS 58   BILITOT 1.5*   Ct Abdomen Pelvis W Contrast  10/21/2015  CLINICAL DATA:  Upper abdominal pain for 1 day. History of alcoholism. EXAM: CT ABDOMEN AND PELVIS WITH CONTRAST TECHNIQUE: Multidetector CT imaging of the abdomen and pelvis was performed using the standard protocol following bolus administration of intravenous contrast. CONTRAST:  80mL OMNIPAQUE IOHEXOL 300 MG/ML  SOLN COMPARISON:  None. FINDINGS: Lower chest: No significant pulmonary nodules or acute consolidative airspace disease. Hepatobiliary: Diffuse hepatic steatosis. Simple 1.3 cm right liver lobe cyst. No additional liver lesions. No gross liver surface irregularity. Normal gallbladder with no radiopaque cholelithiasis. No biliary ductal dilatation. Pancreas: There is diffuse thickening of the entire pancreas with prominent peripancreatic fat stranding and ill-defined fluid along the entire length of the pancreas, in keeping with acute pancreatitis. There is a nonspecific 1.0 x 0.7 cm cystic focus in the pancreatic body (series 2/image 35). No main pancreatic duct dilation. No additional focal pancreatic lesions. No pancreatic gas or areas of pancreatic parenchymal hypoenhancement. There is extension of inflammatory fluid into the left greater than right bilateral anterior para renal retroperitoneal space. There is extension of inflammatory fluid into the central mesentery and gastrocolic  ligament, with a 7.2 x 2.5 x 3.9 cm left gastrocolic ligament fluid collection. Spleen: Normal size. No mass. Adrenals/Urinary Tract: Normal adrenals. Normal kidneys with no hydronephrosis and no renal mass. Normal bladder. Stomach/Bowel: Grossly normal stomach. Normal caliber small bowel with no small bowel wall thickening. Normal appendix. Normal large bowel with no diverticulosis, large bowel wall thickening or pericolonic fat stranding. Vascular/Lymphatic: Normal caliber abdominal aorta. Patent portal, splenic, hepatic and renal veins. No pathologically  enlarged lymph nodes in the abdomen or pelvis. Reproductive: Normal size prostate. Other: No pneumoperitoneum. Small volume ascites, predominantly perihepatic and deep pelvic. Musculoskeletal: No aggressive appearing focal osseous lesions. Severe degenerative disc disease at L5-S1. Prominent Schmorl's nodes in the superior and inferior endplates of the L4 vertebral body. Mild degenerative changes throughout the remaining visualized thoracolumbar spine. Mild gynecomastia, slightly asymmetric to the left. Small fat containing umbilical hernia. Suggestion of a small fat containing left inguinal hernia. IMPRESSION: 1. Acute pancreatitis. No evidence of necrotizing pancreatitis. No vascular complication. 2. Nonspecific 1.0 cm cystic focus in the pancreatic body, which could represent a developing pseudocyst. Recommend attention on follow-up MRI abdomen with and without intravenous contrast in 3 months. 3. Prominent inflammatory fluid extending into the bilateral retroperitoneum and gastrocolic ligament, with a developing left gastrocolic ligament fluid collection as described. 4. Small volume ascites. 5. Diffuse hepatic steatosis. Electronically Signed   By: Delbert Phenix M.D.   On: 10/21/2015 15:22   US Renal  10/23/2015  CLINICAL DATA:  Renal failure and hypertension. EXAM: RENAL / URINARY TRACT ULTRASOUND COMPLETE COMPARISON:  Abdominal CT 10/21/2015 FINDINGS: Right Kidney: Length: 12.8 cm. Echogenicity within normal limits. No mass or hydronephrosis visualized. Left Kidney: Length: 14.8 cm. Echogenicity within normal limits. No mass or hydronephrosis visualized. A central cyst along the mid/lower pole. This cyst measures up to 2.2 cm. Bladder: Appears normal for degree of bladder distention. Other: The liver has increased echogenicity suggesting fat infiltration. Small amount of ascites along the inferior aspect of the liver. IMPRESSION: Normal appearance of both kidneys without hydronephrosis. Left renal cyst.  Fatty liver with ascites. Electronically Signed   By: Richarda Overlie M.D.   On: 10/23/2015 12:11   Dg Chest Port 1 View  10/23/2015  CLINICAL DATA:  Respiratory failure.  Acute pancreatitis. EXAM: PORTABLE CHEST 1 VIEW COMPARISON:  10/22/2015 FINDINGS: The heart is enlarged but stable. Persistent low lung volumes with bibasilar atelectasis and probable small effusions. IMPRESSION: Persistent low lung volumes with bibasilar atelectasis and probable small effusions. Electronically Signed   By: Rudie Meyer M.D.   On: 10/23/2015 09:08   Dg Chest Port 1 View  10/22/2015  CLINICAL DATA:  Hypoxemia EXAM: PORTABLE CHEST - 1 VIEW COMPARISON:  04/20/2015 FINDINGS: Poor inspiratory effort is noted. The cardiac shadow is accentuated by poor inspiratory effort. Right basilar atelectatic changes are noted with blunting of the costophrenic angle. This may represent a small effusion. Crowding of the vascular markings is seen. IMPRESSION: Right basilar atelectasis with likely small effusion present. Electronically Signed   By: Alcide Clever M.D.   On: 10/22/2015 15:02   US Abdomen Limited Ruq  10/22/2015  CLINICAL DATA:  Acute pancreatitis EXAM: US ABDOMEN LIMITED - RIGHT UPPER QUADRANT COMPARISON:  11/15/ 16 CT scan FINDINGS: Gallbladder: No gallstones are noted within gallbladder. There is thickening of gallbladder wall up to 8 mm. No sonographic Murphy's sign. Common bile duct: Diameter: 4.5 mm in diameter within normal limits. Liver: Small perihepatic ascites. There is diffuse increased echogenicity of the  liver suspicious for fatty infiltration. IMPRESSION: 1. No shadowing gallstones are noted within gallbladder. Thickening of gallbladder wall up to 8 mm. No sonographic Murphy's sign. Normal CBD. Small perihepatic ascites. Fatty infiltration of the liver. Electronically Signed   By: Natasha Mead M.D.   On: 10/22/2015 12:20    ROS: No change in vision + SOB No CP + abd pain.  No BM since admission No dysuria No  rashes No new arthritic CO  PHYSICAL EXAM: Blood pressure 124/81, pulse 116, temperature 100.1 F (37.8 C), temperature source Oral, resp. rate 50, height  (1.93 m), weight 111.5 kg (245 lb 13 oz), SpO2 100 %. HEENT: PERRLA EOMI  O2 by [place] NECK:No JVD LUNGS:Decreased BS bases but clear CARDIAC: Tachy, reg  No MRG ION:GEXB BS, + distension. Epigastric-LUQ tenderness + guarding, no rebound EXT:No CCE NEURO:CNI Ox3 No asterixis  Assessment: 1. AKI secondary to acute pancreatitis and contrast.  Low FeNa is consistent with contrast nephropathy 2. Acute pancreatitis 3. ? If has underlying CKD as Scr 1.6 on admission and have no old Scr.  Time will tell 4. thrombocytopenia PLAN: 1. Will try to stimulate UO with IV lasix as "tank seems to be full" so to speak 2. Daily Scr 3. Discussed the possibility of dialysis if renal fx were to worsen and he would do it if needed.  Even if needed, I would be optimistic that renal fx would recover.   Lestat Golob T 10/23/2015, 12:52 PM

## 2015-10-23 NOTE — Progress Notes (Addendum)
Pt RR 50s, St 120s,  BP stable: 110s-130s sys, sats 97-100%. called RR, called PCCM. Admin 2ml Ativan, encouraged use of PCA. Will continue to monitor.

## 2015-10-24 DIAGNOSIS — K859 Acute pancreatitis without necrosis or infection, unspecified: Secondary | ICD-10-CM | POA: Insufficient documentation

## 2015-10-24 DIAGNOSIS — D539 Nutritional anemia, unspecified: Secondary | ICD-10-CM | POA: Insufficient documentation

## 2015-10-24 DIAGNOSIS — K860 Alcohol-induced chronic pancreatitis: Secondary | ICD-10-CM

## 2015-10-24 LAB — RENAL FUNCTION PANEL
Albumin: 1.9 g/dL — ABNORMAL LOW (ref 3.5–5.0)
Anion gap: 11 (ref 5–15)
BUN: 45 mg/dL — ABNORMAL HIGH (ref 6–20)
CHLORIDE: 106 mmol/L (ref 101–111)
CO2: 20 mmol/L — ABNORMAL LOW (ref 22–32)
CREATININE: 4.64 mg/dL — AB (ref 0.61–1.24)
Calcium: 6.9 mg/dL — ABNORMAL LOW (ref 8.9–10.3)
GFR, EST AFRICAN AMERICAN: 17 mL/min — AB (ref >60)
GFR, EST NON AFRICAN AMERICAN: 15 mL/min — AB (ref >60)
Glucose, Bld: 94 mg/dL (ref 65–99)
POTASSIUM: 3.4 mmol/L — AB (ref 3.5–5.1)
Phosphorus: 3.2 mg/dL (ref 2.5–4.6)
Sodium: 137 mmol/L (ref 135–145)

## 2015-10-24 LAB — PROCALCITONIN: Procalcitonin: 14.75 ng/mL

## 2015-10-24 LAB — CBC
HEMATOCRIT: 30.2 % — AB (ref 39.0–52.0)
Hemoglobin: 10.5 g/dL — ABNORMAL LOW (ref 13.0–17.0)
MCH: 36.6 pg — ABNORMAL HIGH (ref 26.0–34.0)
MCHC: 34.8 g/dL (ref 30.0–36.0)
MCV: 105.2 fL — AB (ref 78.0–100.0)
Platelets: 46 10*3/uL — ABNORMAL LOW (ref 150–400)
RBC: 2.87 MIL/uL — AB (ref 4.22–5.81)
RDW: 15.8 % — ABNORMAL HIGH (ref 11.5–15.5)
WBC: 7.6 10*3/uL (ref 4.0–10.5)

## 2015-10-24 MED ORDER — HYDROMORPHONE 1 MG/ML IV SOLN
INTRAVENOUS | Status: DC
  Administered 2015-10-24: 3 mg via INTRAVENOUS
  Administered 2015-10-24: 1.2 mg via INTRAVENOUS
  Administered 2015-10-24: 1.8 mg via INTRAVENOUS
  Administered 2015-10-24: 1.2 mg via INTRAVENOUS
  Administered 2015-10-24: 0.9 mg via INTRAVENOUS
  Administered 2015-10-24: 01:00:00 via INTRAVENOUS
  Administered 2015-10-25: 3.9 mg via INTRAVENOUS
  Administered 2015-10-25: 1.5 mg via INTRAVENOUS
  Administered 2015-10-25: 14:00:00 via INTRAVENOUS
  Administered 2015-10-26: 1.2 mg via INTRAVENOUS
  Administered 2015-10-26 (×2): 1.8 mg via INTRAVENOUS
  Administered 2015-10-27: 0.9 mg via INTRAVENOUS
  Administered 2015-10-27: 1.2 mg via INTRAVENOUS
  Administered 2015-10-27: 0.9 mg via INTRAVENOUS
  Filled 2015-10-24 (×2): qty 25

## 2015-10-24 NOTE — Progress Notes (Signed)
Name: Larry SchatzCalvin Impastato MRN: [id number] DOB: 1976/02/02    ADMISSION DATE:  10/21/2015 CONSULTATION DATE:  10/21/15  REFERRING MD :  Rito EhrlichKrishnan  CHIEF COMPLAINT:  Abd pain  BRIEF PATIENT DESCRIPTION: 39 y.o. male  ad[facility] 11/15 with ETOH pancreatitis.  On 11/16, developed fever and sinus tach; therefore,  PCCM called for further recs.  STUDIES: CT A / P 11/15 >>> acute pancreatitis without evidence of necrotizing pancreatitis.  Nonspecific 1.0cm cyst in pancreatic body which could represent developing pseudocyst.  Inflammatory fluid in retroperitoneum.  Small ascites with diffuse hepatic steatosis. RUQ US 11/15 >>> no gallstones.  GB thickening up to 8mm. 11/17  Renal us neg  SIGNIFICANT EVENTS:  11/15 - admitted with ETOH pancreatitis. 11/16 - PCCM consult for fever and tachycardia.   HISTORY OF PRESENT ILLNESS:  Larry Maldonado is a 39 y.o. male with a PMH as outlined below.  [facility] with epigastric abdominal pain that began 1 night prior.  He had some mild nausea but never vomited.  Pain got too unbearable therefore he came to ED for further evaluation. 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  Workup was suggestive of alcoholic pancreatitis.  [facility].  On 11/16, he developed fever (Tmax 102) and tachycardia.  PCCM was consulted for further recs.      SUBJECTIVE:  Denies chest pain, SOB, N/V/D, myalgias.  Still has abdominal pain, primarily epigastric region but tender throughout.  VITAL SIGNS: Temp:  [99.6 F (37.6 C)-102 F (38.9 C)] 99.7 F (37.6 C) (11/18 0723) Pulse Rate:  [106-123] 106 (11/18 0723) Resp:  [25-59] 39 (11/18 0729) BP: (110-133)/(75-88) 126/88 mmHg (11/18 0723) SpO2:  [97 %-100 %] 100 % (11/18 0729)  PHYSICAL EXAMINATION: General: Adult AA male, resting in bed, in NAD. Much more comfortable Neuro: A&O x 3,  non-focal.  HEENT: [place]/AT. PERRL, sclerae anicteric. Cardiovascular: Decreased hr to st 112, regular, no M/R/G.  Lungs: Respirations even and unlabored.  Decreased bs bases, rr down from 37 to 18 Abdomen: BS hypoactive.   distention with tenderness throughout.  Mild guarding.   Musculoskeletal: No gross deformities, no edema.  Skin: Intact, warm, no rashes.    Recent Labs Lab 10/23/15 0542 10/23/15 1245 10/24/15 0730  NA 140 139 137  K 4.0 3.9 3.4*  CL 108 110 106  CO2 20* 18* 20*  BUN 32* 37* 45*  CREATININE 4.21* 4.38* 4.64*  GLUCOSE 82 93 94    Recent Labs Lab 10/22/15 0358 10/22/15 1829 10/23/15 0542 10/24/15 0730  HGB 13.0  --  13.1 10.5*  HCT 36.9*  --  37.8* 30.2*  WBC 9.1  --  9.0 7.6  PLT 80* 72* 54* 46*   [place] Renal  10/23/2015  CLINICAL DATA:  Renal failure and hypertension. EXAM: RENAL / URINARY TRACT ULTRASOUND COMPLETE COMPARISON:  Abdominal CT 10/21/2015 FINDINGS: Right Kidney: Length: 12.8 cm. Echogenicity within normal limits. No mass or hydronephrosis visualized. Left Kidney: Length: 14.8 cm. Echogenicity within normal limits. No mass or hydronephrosis visualized. A central cyst along the mid/lower pole. This cyst measures up to 2.2 cm. Bladder: Appears normal for degree of bladder distention. Other: The liver has increased echogenicity suggesting fat infiltration. Small amount of ascites along the inferior aspect of the liver. IMPRESSION: Normal appearance of both kidneys without hydronephrosis. Left renal cyst. Fatty liver with ascites.  Electronically Signed   By: Richarda Overlie M.D.   On: 10/23/2015 12:11   Dg Chest Port 1 View  10/23/2015  CLINICAL DATA:  Respiratory failure.  Acute pancreatitis. EXAM: PORTABLE CHEST 1 VIEW COMPARISON:  10/22/2015 FINDINGS: The heart is enlarged but stable. Persistent low lung volumes with bibasilar atelectasis and probable small effusions. IMPRESSION: Persistent low lung volumes with bibasilar atelectasis and probable small  effusions. Electronically Signed   By: Rudie Meyer M.D.   On: 10/23/2015 09:08   Dg Chest Port 1 View  10/22/2015  CLINICAL DATA:  Hypoxemia EXAM: PORTABLE CHEST - 1 VIEW COMPARISON:  04/20/2015 FINDINGS: Poor inspiratory effort is noted. The cardiac shadow is accentuated by poor inspiratory effort. Right basilar atelectatic changes are noted with blunting of the costophrenic angle. This may represent a small effusion. Crowding of the vascular markings is seen. IMPRESSION: Right basilar atelectasis with likely small effusion present. Electronically Signed   By: Alcide Clever M.D.   On: 10/22/2015 15:02   US Abdomen Limited Ruq  10/22/2015  CLINICAL DATA:  Acute pancreatitis EXAM: US ABDOMEN LIMITED - RIGHT UPPER QUADRANT COMPARISON:  11/15/ 16 CT scan FINDINGS: Gallbladder: No gallstones are noted within gallbladder. There is thickening of gallbladder wall up to 8 mm. No sonographic Murphy's sign. Common bile duct: Diameter: 4.5 mm in diameter within normal limits. Liver: Small perihepatic ascites. There is diffuse increased echogenicity of the liver suspicious for fatty infiltration. IMPRESSION: 1. No shadowing gallstones are noted within gallbladder. Thickening of gallbladder wall up to 8 mm. No sonographic Murphy's sign. Normal CBD. Small perihepatic ascites. Fatty infiltration of the liver. Electronically Signed   By: Natasha Mead M.D.   On: 10/22/2015 12:20    ASSESSMENT / PLAN:  SIRS with concern for occult sepsis - ? Pancreatic pseudocysts vs necrotizing pancreatitis. Febrile despite abx Plan: Empiric vanc / primaxin. Obtain blood cultures. PCT 6.94 Follow serial  lactic acids last 1.8 on 11/17  Acute alcoholic pancreatitis. Plan: Continue NPO. Continue aggressive hydration. Follow lipase, LFT's. Trending down  ETOH abuse. Plan: Continue CIWA protocol. Thiamine / folate / multivitamin. ETOH counseling.  Acute hypoxic respiratory failure - likely anxiety + atelectasis.  Right  pleural effusion minimal on CXR; doubt that this is significant contributor to his hypoxia.  Consider underlying PNA. Atelectasis. Right pleural effusion. Plan: Continue supplemental O2 as needed to maintain SpO2 > 92%. Incentive spirometry. Diuresis CXR as needed  Sinus tachycardia - most likely in the setting of pain, fever, and hypovolemia.  Concern for occult sepsis. Improved 11/18 Plan: Continue aggressive hydration. PCA. TSH nl  AKI - likely hypovolemia + contrast.  Concern for ATN with minimal UOP. Lab Results  Component Value Date   CREATININE 4.64* 10/24/2015   CREATININE 4.38* 10/23/2015   CREATININE 4.21* 10/23/2015    Plan: Follow  BMP. Follow urine sodium / urine creatine to assess FENa.  nephrology.has written for lasix drip 11/17 ordered renal ultrasound ,which was negative  Thrombocytopenia - likely due to bone marrow suppression from ETOH abuse. Plan: Monitor platelet counts. Down to 46 11/18  Summary:  11/18 much improved, fever down, hr decreased ,  No resp distress, pain better with pca.   Brett Canales Minor ACNP [facility]ack PCCM Pager [phone] till 3 pm If no answer page [phone] 10/24/2015, 9:05 AM

## 2015-10-24 NOTE — Progress Notes (Signed)
[facility] PROGRESS NOTE  Frutoso SchatzCalvin Zerby ZOX:[id number] DOB: [birth year] DOA: 10/21/2015  PCP: No PCP Per Patient  Brief HPI: 39 year old African-American male with no significant past medical history presented with epigastric abdominal pain with nausea. Patient consumes significant quantities of alcohol on daily basis including beer and Vodka. He was found to have acute pancreatitis. He was hospitalized for further management. Patient was noted to be tachycardic. He was noted also to be going through alcohol withdrawal. He was subsequently transferred to stepdown unit.  Past medical history:  Past Medical History  Diagnosis Date  . Hypertension   . Chronic lower back pain   . Acute alcoholic pancreatitis 10/21/2015  . 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 Pain in the abdomen is 6 out of 10 in intensity. Denies any nausea or vomiting.   Objective: Vital Signs  Filed Vitals:   10/24/15 0000 10/24/15 0130 10/24/15 0400 10/24/15 0645  BP: 127/82  124/80   Pulse: 119 106 112   Temp: 101.4 F (38.6 C)  100.4 F (38 C) 99.8 F (37.7 C)  TempSrc: Rectal  Rectal Rectal  Resp: 47 27 39   Height:      Weight:      SpO2: 100% 98% 100%     Intake/Output Summary (Last 24 hours) at 10/24/15 0717 Last data filed at 10/24/15 0551  Gross per 24 hour  Intake   1532 ml  Output   2750 ml  Net  -1218 ml   Filed Weights   10/21/15 1134 10/21/15 1815 10/22/15 1425  Weight: 106.595 kg (235 lb) 106.595 kg (235 lb) 111.5 kg (245 lb 13 oz)    General appearance: alert, cooperative, appears stated age, no distress and tremulous Resp: clear to auscultation bilaterally Cardio: S1, S2 is tachycardic, but better than before, regular. No S3, S4. No rubs, murmurs, or bruit. No real edema. GI: Abdomen is less tender. Less  distended. No rebound, rigidity or guarding. No masses or organomegaly. More bowel sounds heard today than yesterday.  Extremities: extremities normal, atraumatic, no cyanosis or edema Neurologic: alert and oriented 3. Tremulous. No focal deficits.  Lab Results:  Basic Metabolic Panel:  Recent Labs Lab 10/21/15 1300 10/22/15 0358 10/22/15 1500 10/23/15 0542 10/23/15 1245  NA 135 136 136 140 139  K 3.2* 3.6 4.2 4.0 3.9  CL 97* 101 106 108 110  CO2 27 25 22  20* 18*  GLUCOSE 162* 113* 104* 82 93  BUN 11 17 25* 32* 37*  CREATININE 1.67* 3.20* 3.59* 4.21* 4.38*  CALCIUM 8.5* 8.2* 7.4* 7.1* 6.7*  MG 1.4*  --   --   --   --    Liver Function Tests:  Recent Labs Lab 10/21/15 1300 10/22/15 0358 10/23/15 0542 10/23/15 1245  AST 77* 56* 44* 39  ALT 47 36 25 21  ALKPHOS 54 43 58 44  BILITOT 1.5* 1.6* 1.5* 1.4*  PROT 6.9 5.5* 5.6* 5.1*  ALBUMIN 3.5 2.5* 2.2* 2.0*    Recent Labs Lab 10/21/15 1300 10/22/15 0358 10/23/15 0542  LIPASE 1924* 1145* 149*   CBC:  Recent Labs Lab 10/21/15 1300 10/22/15 0358 10/22/15 1829 10/23/15 0542  WBC 8.9 9.1  --  9.0  NEUTROABS 7.3  --   --   --   HGB 14.0 13.0  --  13.1  HCT 40.6 36.9*  --  37.8*  MCV 106.0* 105.1*  --  105.3*  PLT 186 80* 72* 54*     Studies/Results: US Renal  10/23/2015  CLINICAL DATA:  Renal failure and hypertension. EXAM: RENAL / URINARY TRACT ULTRASOUND COMPLETE COMPARISON:  Abdominal CT 10/21/2015 FINDINGS: Right Kidney: Length: 12.8 cm. Echogenicity within normal limits. No mass or hydronephrosis visualized. Left Kidney: Length: 14.8 cm. Echogenicity within normal limits. No mass or hydronephrosis visualized. A central cyst along the mid/lower pole. This cyst measures up to 2.2 cm. Bladder: Appears normal for degree of bladder distention. Other: The liver has increased echogenicity suggesting fat infiltration. Small amount of ascites along the inferior aspect of the liver. IMPRESSION: Normal appearance of  both kidneys without hydronephrosis. Left renal cyst. Fatty liver with ascites. Electronically Signed   By: Richarda Overlie M.D.   On: 10/23/2015 12:11   Dg Chest Port 1 View  10/23/2015  CLINICAL DATA:  Respiratory failure.  Acute pancreatitis. EXAM: PORTABLE CHEST 1 VIEW COMPARISON:  10/22/2015 FINDINGS: The heart is enlarged but stable. Persistent low lung volumes with bibasilar atelectasis and probable small effusions. IMPRESSION: Persistent low lung volumes with bibasilar atelectasis and probable small effusions. Electronically Signed   By: Rudie Meyer M.D.   On: 10/23/2015 09:08   Dg Chest Port 1 View  10/22/2015  CLINICAL DATA:  Hypoxemia EXAM: PORTABLE CHEST - 1 VIEW COMPARISON:  04/20/2015 FINDINGS: Poor inspiratory effort is noted. The cardiac shadow is accentuated by poor inspiratory effort. Right basilar atelectatic changes are noted with blunting of the costophrenic angle. This may represent a small effusion. Crowding of the vascular markings is seen. IMPRESSION: Right basilar atelectasis with likely small effusion present. Electronically Signed   By: Alcide Clever M.D.   On: 10/22/2015 15:02   US Abdomen Limited Ruq  10/22/2015  CLINICAL DATA:  Acute pancreatitis EXAM: US ABDOMEN LIMITED - RIGHT UPPER QUADRANT COMPARISON:  11/15/ 16 CT scan FINDINGS: Gallbladder: No gallstones are noted within gallbladder. There is thickening of gallbladder wall up to 8 mm. No sonographic Murphy's sign. Common bile duct: Diameter: 4.5 mm in diameter within normal limits. Liver: Small perihepatic ascites. There is diffuse increased echogenicity of the liver suspicious for fatty infiltration. IMPRESSION: 1. No shadowing gallstones are noted within gallbladder. Thickening of gallbladder wall up to 8 mm. No sonographic Murphy's sign. Normal CBD. Small perihepatic ascites. Fatty infiltration of the liver. Electronically Signed   By: Natasha Mead M.D.   On: 10/22/2015 12:20    Medications:  Scheduled: . folic acid   1 mg Oral Daily  . furosemide  160 mg Intravenous 3 times per day  . HYDROmorphone   Intravenous 6 times per day  . imipenem-cilastatin  250 mg Intravenous 4 times per day  . LORazepam  0-4 mg Intravenous Q6H   Followed by  . LORazepam  0-4 mg Intravenous Q12H  . multivitamin with minerals  1 tablet Oral Daily  . sodium chloride  3 mL Intravenous Q12H  . thiamine  100 mg Oral Daily   Or  . thiamine  100 mg Intravenous Daily   Continuous: . sodium chloride 125 mL/hr at 10/24/15 0459   ZOX:[id number], fentaNYL (SUBLIMAZE) injection, LORazepam **OR** LORazepam, ondansetron (ZOFRAN) IV  Assessment/Plan:  Principal Problem:   Acute alcoholic pancreatitis Active Problems:   ETOH abuse   Acute renal failure (HCC)   Alcohol withdrawal (HCC)   Thrombocytopenia (HCC)   Respiratory failure (HCC)   Pyrexia    Acute alcoholic pancreatitis Patient appears to be  slightly better this morning. Pain is better controlled. Continue Dilaudid PCA. Continue imipenem for now. Cultures are pending. Vancomycin was given just for 1 day and then was discontinued. Lipase is improved. Leave nothing by mouth for today. Consider clear liquid diet tomorrow. If patient does not improve as anticipated, may need to repeat CT scan.  Fever Continues to be febrile, however appears that temperature is decreasing today. Continue cooling blankets. Most likely this is secondary to severe inflammation from acute pancreatitis. Continue meropenem for now. Await cultures. CT scan did not show any evidence for necrotizing pancreatitis.   Acute renal failure/hypokalemia/non anion gap metabolic acidosis Most likely secondary to contrast nephropathy. Appreciate nephrology input. Patient has been initiated on intravenous Lasix. We'll cut back on IV fluids. Creatinine is higher today, but the rate of increase is not as significant as before. Patient is making more urine than he was yesterday. Renal ultrasound does not show  any hydronephrosis. Continue to monitor closely. Nephrology to address electrolyte imbalances.  Sinus tachycardia Probably related to acute illness/pain/alcohol withdrawal. TSH is normal. EKG showed sinus tachycardia. Heart rate appears to be improving. Continue to monitor closely.  Alcohol abuse with withdrawal symptoms Patient remained stable. Withdrawal symptoms were improving. Continue CIWA protocol. Thiamine, folic acid and multivitamins. 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 Close monitoring.  Thrombocytopenia Likely due to alcohol and acute illness. There could also be an element of DIC. No overt bleeding. Continue to monitor closely. Counts should rebound as patient improves.  Macrocytic anemia TSH is normal. Will check anemia panel tomorrow morning. Drop in hemoglobin, likely dilutional.  DVT Prophylaxis: SCD's Code Status: Full Code  Family Communication: discussed with the patient   Disposition Plan: will remain in step down. Perhaps showing early signs of improvement.     LOS: 3 days   [facility]  [facility] Pager [phone] 10/24/2015, 7:17 AM  If 7PM-7AM, please contact night-coverage at [email or web address], password [email or web address]

## 2015-10-24 NOTE — Progress Notes (Signed)
[name] ROUNDING NOTE   Subjective:   Interval History: pain controlled  No complaints    Objective:  Vital signs in last 24 hours:  Temp:  [99.6 F (37.6 C)-102 F (38.9 C)] 100 F (37.8 C) (11/18 1100) Pulse Rate:  [106-123] 106 (11/18 1100) Resp:  [25-57] 29 (11/18 1111) BP: (110-137)/(75-88) 137/88 mmHg (11/18 1100) SpO2:  [97 %-100 %] 99 % (11/18 1111)  Weight change:  Filed Weights   10/21/15 1134 10/21/15 1815 10/22/15 1425  Weight: 106.595 kg (235 lb) 106.595 kg (235 lb) 111.5 kg (245 lb 13 oz)    Intake/Output: I/O last 3 completed shifts: In: 3444.5 [I.V.:2812.5; IV Piggyback:632] Out: 2950 [Urine:2950]   Intake/Output this shift:  Total I/O In: -  Out: 1050 [Urine:1050]  CVS- RRR RS- CTA ABD- distended  Bowel sounds hypoactive EXT- trace edema   Basic Metabolic Panel:  Recent Labs Lab 10/21/15 1300 10/22/15 0358 10/22/15 1500 10/23/15 0542 10/23/15 1245 10/24/15 0730  NA 135 136 136 140 139 137  K 3.2* 3.6 4.2 4.0 3.9 3.4*  CL 97* 101 106 108 110 106  CO2 27 25 22  20* 18* 20*  GLUCOSE 162* 113* 104* 82 93 94  BUN 11 17 25* 32* 37* 45*  CREATININE 1.67* 3.20* 3.59* 4.21* 4.38* 4.64*  CALCIUM 8.5* 8.2* 7.4* 7.1* 6.7* 6.9*  MG 1.4*  --   --   --   --   --   PHOS  --   --   --   --   --  3.2    Liver Function Tests:  Recent Labs Lab 10/21/15 1300 10/22/15 0358 10/23/15 0542 10/23/15 1245 10/24/15 0730  AST 77* 56* 44* 39  --   ALT 47 36 25 21  --   ALKPHOS 54 43 58 44  --   BILITOT 1.5* 1.6* 1.5* 1.4*  --   PROT 6.9 5.5* 5.6* 5.1*  --   ALBUMIN 3.5 2.5* 2.2* 2.0* 1.9*    Recent Labs Lab 10/21/15 1300 10/22/15 0358 10/23/15 0542  LIPASE 1924* 1145* 149*   No results for input(s): AMMONIA in the last 168 hours.  CBC:  Recent Labs Lab 10/21/15 1300 10/22/15 0358 10/22/15 1829 10/23/15 0542 10/24/15 0730  WBC 8.9 9.1  --  9.0 7.6  NEUTROABS 7.3  --   --   --   --   HGB 14.0 13.0  --  13.1 10.5*   HCT 40.6 36.9*  --  37.8* 30.2*  MCV 106.0* 105.1*  --  105.3* 105.2*  PLT 186 80* 72* 54* 46*    Cardiac Enzymes:  Recent Labs Lab 10/23/15 0542  CKTOTAL 107    BNP: Invalid input(s): POCBNP  CBG: No results for input(s): GLUCAP in the last 168 hours.  Microbiology: Results for orders placed or performed during the hospital encounter of 10/21/15  Culture, blood (routine x 2)     Status: None (Preliminary result)   Collection Time: 10/22/15  3:35 PM  Result Value Ref Range Status   Specimen Description BLOOD RIGHT ARM  Final   Special Requests IN PEDIATRIC BOTTLE  2CC  Final   Culture NO GROWTH 2 DAYS  Final   Report Status PENDING  Incomplete  Culture, blood (routine x 2)     Status: None (Preliminary result)   Collection Time: 10/22/15  3:40 PM  Result Value Ref Range Status   Specimen Description BLOOD RIGHT HAND  Final   Special Requests BOTTLES DRAWN AEROBIC ONLY  4CC  Final   Culture NO GROWTH 2 DAYS  Final   Report Status PENDING  Incomplete  Culture, Urine     Status: None   Collection Time: 10/22/15  5:25 PM  Result Value Ref Range Status   Specimen Description URINE, CLEAN CATCH  Final   Special Requests NONE  Final   Culture 1,000 COLONIES/mL INSIGNIFICANT GROWTH  Final   Report Status 10/23/2015 FINAL  Final  MRSA PCR Screening     Status: None   Collection Time: 10/22/15 10:21 PM  Result Value Ref Range Status   MRSA by PCR NEGATIVE NEGATIVE Final    Comment:        The GeneXpert MRSA Assay (FDA approved for NASAL specimens only), is one component of a comprehensive MRSA colonization surveillance program. It is not intended to diagnose MRSA infection nor to guide or monitor treatment for MRSA infections.     Coagulation Studies:  Recent Labs  10/21/15 1300 10/22/15 1829  LABPROT 13.7 15.4*  INR 1.03 1.20    Urinalysis:  Recent Labs  10/22/15 1725  COLORURINE ORANGE*  LABSPEC 1.035*  PHURINE 5.0  GLUCOSEU NEGATIVE  HGBUR  LARGE*  BILIRUBINUR MODERATE*  KETONESUR 15*  PROTEINUR 100*  NITRITE POSITIVE*  LEUKOCYTESUR SMALL*      Imaging: US Renal  10/23/2015  CLINICAL DATA:  Renal failure and hypertension. EXAM: RENAL / URINARY TRACT ULTRASOUND COMPLETE COMPARISON:  Abdominal CT 10/21/2015 FINDINGS: Right Kidney: Length: 12.8 cm. Echogenicity within normal limits. No mass or hydronephrosis visualized. Left Kidney: Length: 14.8 cm. Echogenicity within normal limits. No mass or hydronephrosis visualized. A central cyst along the mid/lower pole. This cyst measures up to 2.2 cm. Bladder: Appears normal for degree of bladder distention. Other: The liver has increased echogenicity suggesting fat infiltration. Small amount of ascites along the inferior aspect of the liver. IMPRESSION: Normal appearance of both kidneys without hydronephrosis. Left renal cyst. Fatty liver with ascites. Electronically Signed   By: Richarda Overlie M.D.   On: 10/23/2015 12:11   Dg Chest Port 1 View  10/23/2015  CLINICAL DATA:  Respiratory failure.  Acute pancreatitis. EXAM: PORTABLE CHEST 1 VIEW COMPARISON:  10/22/2015 FINDINGS: The heart is enlarged but stable. Persistent low lung volumes with bibasilar atelectasis and probable small effusions. IMPRESSION: Persistent low lung volumes with bibasilar atelectasis and probable small effusions. Electronically Signed   By: Rudie Meyer M.D.   On: 10/23/2015 09:08   Dg Chest Port 1 View  10/22/2015  CLINICAL DATA:  Hypoxemia EXAM: PORTABLE CHEST - 1 VIEW COMPARISON:  04/20/2015 FINDINGS: Poor inspiratory effort is noted. The cardiac shadow is accentuated by poor inspiratory effort. Right basilar atelectatic changes are noted with blunting of the costophrenic angle. This may represent a small effusion. Crowding of the vascular markings is seen. IMPRESSION: Right basilar atelectasis with likely small effusion present. Electronically Signed   By: Alcide Clever M.D.   On: 10/22/2015 15:02     Medications:    . sodium chloride 100 mL/hr at 10/24/15 0912   . folic acid  1 mg Oral Daily  . furosemide  160 mg Intravenous 3 times per day  . HYDROmorphone   Intravenous 6 times per day  . imipenem-cilastatin  250 mg Intravenous 4 times per day  . LORazepam  0-4 mg Intravenous Q12H  . multivitamin with minerals  1 tablet Oral Daily  . sodium chloride  3 mL Intravenous Q12H  . thiamine  100 mg Oral Daily   Or  .  thiamine  100 mg Intravenous Daily   acetaminophen, fentaNYL (SUBLIMAZE) injection, LORazepam **OR** LORazepam, ondansetron (ZOFRAN) IV  Assessment/ Plan:  39yo BM admitted 10/21/15 For abd pain and pancreatitis. Denies any hx of CKD and says his BP is variable but has never been treated with antiHTN but also has not seen a doctor in a long while. No baseline Scr but Scr on admission was 1.6. He received CT with contrast on 11/15. Since admission Scr has progressively increased to 4.2.  Acute kidney Injury    Creatinine is stable -- most likely ATn     Monitor serial renal panels     Is and Os Volume      Probable ileus and 3rd spacing      Continue IVF Hypokalemia      Replete Hypocalcemia       Secondary to pancreatitis         Follow            LOS: 3 Larry Maldonado  :50 PM

## 2015-10-24 NOTE — Progress Notes (Signed)
Fentanyl 400mcg IV wasted in sharps. Nedra HaiLee, RN witnessed waste.

## 2015-10-25 ENCOUNTER — Inpatient Hospital Stay (HOSPITAL_COMMUNITY): Payer: Self-pay

## 2015-10-25 LAB — CBC
HCT: 33 % — ABNORMAL LOW (ref 39.0–52.0)
Hemoglobin: 11.2 g/dL — ABNORMAL LOW (ref 13.0–17.0)
MCH: 35.8 pg — ABNORMAL HIGH (ref 26.0–34.0)
MCHC: 33.9 g/dL (ref 30.0–36.0)
MCV: 105.4 fL — AB (ref 78.0–100.0)
PLATELETS: 70 10*3/uL — AB (ref 150–400)
RBC: 3.13 MIL/uL — ABNORMAL LOW (ref 4.22–5.81)
RDW: 15.7 % — AB (ref 11.5–15.5)
WBC: 9 10*3/uL (ref 4.0–10.5)

## 2015-10-25 LAB — COMPREHENSIVE METABOLIC PANEL
ALBUMIN: 2.3 g/dL — AB (ref 3.5–5.0)
ALT: 17 U/L (ref 17–63)
AST: 30 U/L (ref 15–41)
Alkaline Phosphatase: 61 U/L (ref 38–126)
Anion gap: 15 (ref 5–15)
BUN: 54 mg/dL — AB (ref 6–20)
CHLORIDE: 103 mmol/L (ref 101–111)
CO2: 19 mmol/L — ABNORMAL LOW (ref 22–32)
Calcium: 7.5 mg/dL — ABNORMAL LOW (ref 8.9–10.3)
Creatinine, Ser: 4.79 mg/dL — ABNORMAL HIGH (ref 0.61–1.24)
GFR calc Af Amer: 16 mL/min — ABNORMAL LOW (ref >60)
GFR, EST NON AFRICAN AMERICAN: 14 mL/min — AB (ref >60)
Glucose, Bld: 90 mg/dL (ref 65–99)
POTASSIUM: 3.7 mmol/L (ref 3.5–5.1)
Sodium: 137 mmol/L (ref 135–145)
Total Bilirubin: 1 mg/dL (ref 0.3–1.2)
Total Protein: 6.3 g/dL — ABNORMAL LOW (ref 6.5–8.1)

## 2015-10-25 LAB — VITAMIN B12: VITAMIN B 12: 172 pg/mL — AB (ref 180–914)

## 2015-10-25 LAB — RETICULOCYTES
RBC.: 3.13 MIL/uL — ABNORMAL LOW (ref 4.22–5.81)
Retic Count, Absolute: 65.7 10*3/uL (ref 19.0–186.0)
Retic Ct Pct: 2.1 % (ref 0.4–3.1)

## 2015-10-25 LAB — IRON AND TIBC
IRON: 12 ug/dL — AB (ref 45–182)
Saturation Ratios: 7 % — ABNORMAL LOW (ref 17.9–39.5)
TIBC: 171 ug/dL — AB (ref 250–450)
UIBC: 159 ug/dL

## 2015-10-25 LAB — FOLATE: Folate: 10.9 ng/mL (ref >5.9)

## 2015-10-25 LAB — PHOSPHORUS: PHOSPHORUS: 4.4 mg/dL (ref 2.5–4.6)

## 2015-10-25 LAB — FERRITIN: FERRITIN: 1307 ng/mL — AB (ref 24–336)

## 2015-10-25 LAB — LIPASE, BLOOD: LIPASE: 116 U/L — AB (ref 11–51)

## 2015-10-25 MED ORDER — VANCOMYCIN HCL 10 G IV SOLR
2000.0000 mg | Freq: Once | INTRAVENOUS | Status: AC
  Administered 2015-10-25: 2000 mg via INTRAVENOUS
  Filled 2015-10-25: qty 2000

## 2015-10-25 MED ORDER — SODIUM CHLORIDE 0.9 % IV SOLN
1500.0000 mg | INTRAVENOUS | Status: DC
  Filled 2015-10-25: qty 1500

## 2015-10-25 MED ORDER — IOHEXOL 300 MG/ML  SOLN
25.0000 mL | INTRAMUSCULAR | Status: AC
  Administered 2015-10-25 (×2): 25 mL via ORAL

## 2015-10-25 NOTE — Progress Notes (Signed)
Wasted 3mls dilaudid pca with Celenia, RN. Marisue Ivanobyn Tirrell Buchberger RN

## 2015-10-25 NOTE — Progress Notes (Signed)
[facility] ROUNDING NOTE   Subjective:   Interval History: Feels hungry and asking for juice -- plan contrast enhanced CT this morning  Objective:  Vital signs in last 24 hours:  Temp:  [99.7 F (37.6 C)-101 F (38.3 C)] 99.7 F (37.6 C) (11/19 0730) Pulse Rate:  [106-126] 121 (11/19 0730) Resp:  [15-33] 15 (11/19 0830) BP: (99-137)/(73-88) 117/80 mmHg (11/19 0730) SpO2:  [98 %-100 %] 99 % (11/19 0830)  Weight change:  Filed Weights   10/21/15 1134 10/21/15 1815 10/22/15 1425  Weight: 106.595 kg (235 lb) 106.595 kg (235 lb) 111.5 kg (245 lb 13 oz)    Intake/Output: I/O last 3 completed shifts: In: 4752.3 [P.O.:480; I.V.:3408.3; IV Piggyback:864] Out: 5550 [Urine:5550]   Intake/Output this shift:  Total I/O In: 216 [I.V.:150; IV Piggyback:66] Out: 150 [Urine:150]  CVS- RRR RS- CTA ABD-  Bowel sounds hypoactive and distended abdomen EXT- no edema   Basic Metabolic Panel:  Recent Labs Lab 10/21/15 1300  10/22/15 1500 10/23/15 0542 10/23/15 1245 10/24/15 0730 10/25/15 0402  NA 135  < > 136 140 139 137 137  K 3.2*  < > 4.2 4.0 3.9 3.4* 3.7  CL 97*  < > 106 108 110 106 103  CO2 27  < > 22 20* 18* 20* 19*  GLUCOSE 162*  < > 104* 82 93 94 90  BUN 11  < > 25* 32* 37* 45* 54*  CREATININE 1.67*  < > 3.59* 4.21* 4.38* 4.64* 4.79*  CALCIUM 8.5*  < > 7.4* 7.1* 6.7* 6.9* 7.5*  MG 1.4*  --   --   --   --   --   --   PHOS  --   --   --   --   --  3.2 4.4  < > = values in this interval not displayed.  Liver Function Tests:  Recent Labs Lab 10/21/15 1300 10/22/15 0358 10/23/15 0542 10/23/15 1245 10/24/15 0730 10/25/15 0402  AST 77* 56* 44* 39  --  30  ALT 47 36 25 21  --  17  ALKPHOS 54 43 58 44  --  61  BILITOT 1.5* 1.6* 1.5* 1.4*  --  1.0  PROT 6.9 5.5* 5.6* 5.1*  --  6.3*  ALBUMIN 3.5 2.5* 2.2* 2.0* 1.9* 2.3*    Recent Labs Lab 10/21/15 1300 10/22/15 0358 10/23/15 0542 10/25/15 0402  LIPASE 1924* 1145* 149* 116*   No results for  input(s): AMMONIA in the last 168 hours.  CBC:  Recent Labs Lab 10/21/15 1300 10/22/15 0358 10/22/15 1829 10/23/15 0542 10/24/15 0730 10/25/15 0402  WBC 8.9 9.1  --  9.0 7.6 9.0  NEUTROABS 7.3  --   --   --   --   --   HGB 14.0 13.0  --  13.1 10.5* 11.2*  HCT 40.6 36.9*  --  37.8* 30.2* 33.0*  MCV 106.0* 105.1*  --  105.3* 105.2* 105.4*  PLT 186 80* 72* 54* 46* 70*    Cardiac Enzymes:  Recent Labs Lab 10/23/15 0542  CKTOTAL 107    BNP: Invalid input(s): POCBNP  CBG: No results for input(s): GLUCAP in the last 168 hours.  Microbiology: Results for orders placed or performed during the hospital encounter of 10/21/15  Culture, blood (routine x 2)     Status: None (Preliminary result)   Collection Time: 10/22/15  3:35 PM  Result Value Ref Range Status   Specimen Description BLOOD RIGHT ARM  Final   Special  Requests IN PEDIATRIC BOTTLE  2CC  Final   Culture NO GROWTH 2 DAYS  Final   Report Status PENDING  Incomplete  Culture, blood (routine x 2)     Status: None (Preliminary result)   Collection Time: 10/22/15  3:40 PM  Result Value Ref Range Status   Specimen Description BLOOD RIGHT HAND  Final   Special Requests BOTTLES DRAWN AEROBIC ONLY  4CC  Final   Culture NO GROWTH 2 DAYS  Final   Report Status PENDING  Incomplete  Culture, Urine     Status: None   Collection Time: 10/22/15  5:25 PM  Result Value Ref Range Status   Specimen Description URINE, CLEAN CATCH  Final   Special Requests NONE  Final   Culture 1,000 COLONIES/mL INSIGNIFICANT GROWTH  Final   Report Status 10/23/2015 FINAL  Final  MRSA PCR Screening     Status: None   Collection Time: 10/22/15 10:21 PM  Result Value Ref Range Status   MRSA by PCR NEGATIVE NEGATIVE Final    Comment:        The GeneXpert MRSA Assay (FDA approved for NASAL specimens only), is one component of a comprehensive MRSA colonization surveillance program. It is not intended to diagnose MRSA infection nor to guide  or monitor treatment for MRSA infections.     Coagulation Studies:  Recent Labs  10/22/15 1829  LABPROT 15.4*  INR 1.20    Urinalysis:  Recent Labs  10/22/15 1725  COLORURINE ORANGE*  LABSPEC 1.035*  PHURINE 5.0  GLUCOSEU NEGATIVE  HGBUR LARGE*  BILIRUBINUR MODERATE*  KETONESUR 15*  PROTEINUR 100*  NITRITE POSITIVE*  LEUKOCYTESUR SMALL*      Imaging: US Renal  10/23/2015  CLINICAL DATA:  Renal failure and hypertension. EXAM: RENAL / URINARY TRACT ULTRASOUND COMPLETE COMPARISON:  Abdominal CT 10/21/2015 FINDINGS: Right Kidney: Length: 12.8 cm. Echogenicity within normal limits. No mass or hydronephrosis visualized. Left Kidney: Length: 14.8 cm. Echogenicity within normal limits. No mass or hydronephrosis visualized. A central cyst along the mid/lower pole. This cyst measures up to 2.2 cm. Bladder: Appears normal for degree of bladder distention. Other: The liver has increased echogenicity suggesting fat infiltration. Small amount of ascites along the inferior aspect of the liver. IMPRESSION: Normal appearance of both kidneys without hydronephrosis. Left renal cyst. Fatty liver with ascites. Electronically Signed   By: Richarda Overlie M.D.   On: 10/23/2015 12:11     Medications:   . sodium chloride 100 mL/hr at 10/25/15 0730   . folic acid  1 mg Oral Daily  . furosemide  160 mg Intravenous 3 times per day  . HYDROmorphone   Intravenous 6 times per day  . imipenem-cilastatin  250 mg Intravenous 4 times per day  . iohexol  25 mL Oral Q1 Hr x 2  . LORazepam  0-4 mg Intravenous Q12H  . multivitamin with minerals  1 tablet Oral Daily  . sodium chloride  3 mL Intravenous Q12H  . thiamine  100 mg Oral Daily   Or  . thiamine  100 mg Intravenous Daily   acetaminophen, fentaNYL (SUBLIMAZE) injection, LORazepam **OR** LORazepam, ondansetron (ZOFRAN) IV  Assessment/ Plan:  39yo BM admitted 10/21/15 For abd pain and pancreatitis. Denies any hx of CKD and says his BP is  variable but has never been treated with antiHTN but also has not seen a doctor in a long while. No baseline Scr but Scr on admission was 1.6. He received CT with contrast on 11/15. Since admission  Scr has progressively increased to 4.79.  Acute kidney Injury  Creatinine is stable -- most likely ATN  Monitor serial renal panels  Is and Os     Great urine output  Continue IV fluids  Volume  Probable ileus and 3rd spacing -  Positive fluid balance  Continue IVF Hypokalemia  Repleted Hypocalcemia  Secondary to pancreatitis         Corrected calcium is normal range Pancreatitis        Started Imipenum        Repeat CT scan   LOS: 4 Arlena Marsan W @TODAY @9 :49 AM

## 2015-10-25 NOTE — Progress Notes (Addendum)
[facility] PROGRESS NOTE  Larry Maldonado GNF:[id number] DOB: Mar 20, 1976 DOA: 10/21/2015  PCP: No PCP Per Patient  Brief HPI: 39 year old African-American male with no significant past medical history presented with epigastric abdominal pain with nausea. Patient consumes significant quantities of alcohol on daily basis including beer and Vodka. He was found to have acute pancreatitis. He was hospitalized for further management. Patient was noted to be tachycardic. He was noted also to be going through alcohol withdrawal. He was subsequently transferred to stepdown unit.  Past medical history:  Past Medical History  Diagnosis Date  . Hypertension   . Chronic lower back pain   . Acute alcoholic pancreatitis 10/21/2015  . Alcohol related seizure (HCC) 04/2015    [name] 10/21/2015    Consultants: critical care medicine, nephrology  Procedures: none  Antibiotics: Imipenem 11/16  Subjective: Patient continues to feel better. Not in as much pain as before. Wondering if he can have something to eat and drink. Denies any nausea, vomiting. He is making urine.   Objective: Vital Signs  Filed Vitals:   10/25/15 0024 10/25/15 0105 10/25/15 0351 10/25/15 0630  BP: 99/73 128/78 124/77   Pulse: 116 116 124   Temp: 100.5 F (38.1 C)  100.7 F (38.2 C) 99.8 F (37.7 C)  TempSrc: Rectal  Rectal Rectal  Resp: 22 29 33   Height:      Weight:      SpO2: 100% 100% 100%     Intake/Output Summary (Last 24 hours) at 10/25/15 0734 Last data filed at 10/25/15 0630  Gross per 24 hour  Intake 3486.33 ml  Output   3075 ml  Net 411.33 ml   Filed Weights   10/21/15 1134 10/21/15 1815 10/22/15 1425  Weight: 106.595 kg (235 lb) 106.595 kg (235 lb) 111.5 kg (245 lb 13 oz)    General appearance: alert, cooperative, appears stated age, no distress and tremulous Resp: Mildly diminished air entry at the bases without any wheezing, crackles. Cardio: S1, S2 is tachycardic, regular. No S3,  S4. No rubs, murmurs, or bruit. No real edema. GI: Abdomen is less tender. Less distended. No rebound, rigidity or guarding. No masses or organomegaly. Bowel sounds are present.  Extremities: extremities normal, atraumatic, no cyanosis or edema Neurologic: alert and oriented 3. Tremulous. No focal deficits.  Lab Results: Results for orders placed or performed during the hospital encounter of 10/21/15  Culture, blood (routine x 2)     Status: None (Preliminary result)   Collection Time: 10/22/15  3:35 PM  Result Value Ref Range Status   Specimen Description BLOOD RIGHT ARM  Final   Special Requests IN PEDIATRIC BOTTLE  2CC  Final   Culture NO GROWTH 2 DAYS  Final   Report Status PENDING  Incomplete  Culture, blood (routine x 2)     Status: None (Preliminary result)   Collection Time: 10/22/15  3:40 PM  Result Value Ref Range Status   Specimen Description BLOOD RIGHT HAND  Final   Special Requests BOTTLES DRAWN AEROBIC ONLY  4CC  Final   Culture NO GROWTH 2 DAYS  Final   Report Status PENDING  Incomplete  Culture, Urine     Status: None   Collection Time: 10/22/15  5:25 PM  Result Value Ref Range Status   Specimen Description URINE, CLEAN CATCH  Final   Special Requests NONE  Final   Culture 1,000 COLONIES/mL INSIGNIFICANT GROWTH  Final   Report Status 10/23/2015 FINAL  Final  MRSA PCR Screening  Status: None   Collection Time: 10/22/15 10:21 PM  Result Value Ref Range Status   MRSA by PCR NEGATIVE NEGATIVE Final    Comment:        The GeneXpert MRSA Assay (FDA approved for NASAL specimens only), is one component of a comprehensive MRSA colonization surveillance program. It is not intended to diagnose MRSA infection nor to guide or monitor treatment for MRSA infections.     Basic Metabolic Panel:  Recent Labs Lab 10/21/15 1300  10/22/15 1500 10/23/15 0542 10/23/15 1245 10/24/15 0730 10/25/15 0402  NA 135  < > 136 140 139 137 137  K 3.2*  < > 4.2 4.0 3.9 3.4*  3.7  CL 97*  < > 106 108 110 106 103  CO2 27  < > 22 20* 18* 20* 19*  GLUCOSE 162*  < > 104* 82 93 94 90  BUN 11  < > 25* 32* 37* 45* 54*  CREATININE 1.67*  < > 3.59* 4.21* 4.38* 4.64* 4.79*  CALCIUM 8.5*  < > 7.4* 7.1* 6.7* 6.9* 7.5*  MG 1.4*  --   --   --   --   --   --   PHOS  --   --   --   --   --  3.2 4.4  < > = values in this interval not displayed. Liver Function Tests:  Recent Labs Lab 10/21/15 1300 10/22/15 0358 10/23/15 0542 10/23/15 1245 10/24/15 0730 10/25/15 0402  AST 77* 56* 44* 39  --  30  ALT 47 36 25 21  --  17  ALKPHOS 54 43 58 44  --  61  BILITOT 1.5* 1.6* 1.5* 1.4*  --  1.0  PROT 6.9 5.5* 5.6* 5.1*  --  6.3*  ALBUMIN 3.5 2.5* 2.2* 2.0* 1.9* 2.3*    Recent Labs Lab 10/21/15 1300 10/22/15 0358 10/23/15 0542 10/25/15 0402  LIPASE 1924* 1145* 149* 116*   CBC:  Recent Labs Lab 10/21/15 1300 10/22/15 0358 10/22/15 1829 10/23/15 0542 10/24/15 0730 10/25/15 0402  WBC 8.9 9.1  --  9.0 7.6 9.0  NEUTROABS 7.3  --   --   --   --   --   HGB 14.0 13.0  --  13.1 10.5* 11.2*  HCT 40.6 36.9*  --  37.8* 30.2* 33.0*  MCV 106.0* 105.1*  --  105.3* 105.2* 105.4*  PLT 186 80* 72* 54* 46* 70*     Studies/Results: [facility] Renal  10/23/2015  CLINICAL DATA:  Renal failure and hypertension. EXAM: RENAL / URINARY TRACT ULTRASOUND COMPLETE COMPARISON:  Abdominal CT 10/21/2015 FINDINGS: Right Kidney: Length: 12.8 cm. Echogenicity within normal limits. No mass or hydronephrosis visualized. Left Kidney: Length: 14.8 cm. Echogenicity within normal limits. No mass or hydronephrosis visualized. A central cyst along the mid/lower pole. This cyst measures up to 2.2 cm. Bladder: Appears normal for degree of bladder distention. Other: The liver has increased echogenicity suggesting fat infiltration. Small amount of ascites along the inferior aspect of the liver. IMPRESSION: Normal appearance of both kidneys without hydronephrosis. Left renal cyst. Fatty liver with ascites.  Electronically Signed   By: Richarda OverlieAdam  Henn M.D.   On: 10/23/2015 12:11   Dg Chest Port 1 View  10/23/2015  CLINICAL DATA:  Respiratory failure.  Acute pancreatitis. EXAM: PORTABLE CHEST 1 VIEW COMPARISON:  10/22/2015 FINDINGS: The heart is enlarged but stable. Persistent low lung volumes with bibasilar atelectasis and probable small effusions. IMPRESSION: Persistent low lung volumes with bibasilar atelectasis and probable  small effusions. Electronically Signed   By: Rudie Meyer M.D.   On: 10/23/2015 09:08    Medications:  Scheduled: . folic acid  1 mg Oral Daily  . furosemide  160 mg Intravenous 3 times per day  . HYDROmorphone   Intravenous 6 times per day  . imipenem-cilastatin  250 mg Intravenous 4 times per day  . LORazepam  0-4 mg Intravenous Q12H  . multivitamin with minerals  1 tablet Oral Daily  . sodium chloride  3 mL Intravenous Q12H  . thiamine  100 mg Oral Daily   Or  . thiamine  100 mg Intravenous Daily   Continuous: . sodium chloride 100 mL/hr at 10/25/15 0357   ZOX:[id number], fentaNYL (SUBLIMAZE) injection, LORazepam **OR** LORazepam, ondansetron (ZOFRAN) IV  Assessment/Plan:  Principal Problem:   Acute alcoholic pancreatitis Active Problems:   ETOH abuse   Acute renal failure (HCC)   Alcohol withdrawal (HCC)   Thrombocytopenia (HCC)   Respiratory failure (HCC)   Pyrexia   Pancreatitis   Macrocytic anemia    Acute alcoholic pancreatitis Patient continues to have fevers. Could still be due to inflammation. However, despite 3 days of antibiotics, and 4 days of hospitalization. Fever is concerning at this point. We will repeat CT scan of the abdomen and pelvis. Unable to give IV contrast due to renal failure. Overall clinically he seems to be much more comfortable than he was 2 days ago. Continue Dilaudid PCA. Continue imipenem for now. Cultures are pending. Vancomycin was given just for 1 day and then was discontinued. Lipase is improved. Consider clear liquid  on findings on CT scan.   Fever Continues to be febrile. Still most likely secondary to pancreatic inflammation. However, fevers have been persisting for more than 72 hours now. We will repeat his CT scan as discussed above. Continue imipenem. Cultures are negative so far.  Acute renal failure/hypokalemia/non anion gap metabolic acidosis Most likely secondary to contrast nephropathy. Nephrology is following. Discussed with Dr. Hyman Hopes today. He recommends discontinuing the Lasix for now. Patient is making adequate amounts of urine. Continue IV fluids. Monitor creatinine and urine output closely. Renal ultrasound does not show any hydronephrosis.   Sinus tachycardia Probably related to acute illness/pain/alcohol withdrawal. TSH is normal. EKG showed sinus tachycardia. Heart rate appears to be stable. Continue to monitor closely.  Alcohol abuse with withdrawal symptoms Patient remained stable. Withdrawal symptoms are improving. Continue CIWA protocol. Thiamine, folic acid and multivitamins. 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 Close monitoring.  Thrombocytopenia Likely due to alcohol and acute illness. There could also be an element of DIC. No overt bleeding. Continue to monitor closely. Counts should rebound as patient improves. Platelet counts improved today. Continue to monitor daily.  Macrocytic anemia TSH is normal. Drop in hemoglobin, likely dilutional. Anemia panel reviewed. Elevated ferritin, most likely due to inflammation. Vitamin B-12 noted to be low. Will need supplementation.   ADDENDUM: CT abdomen report reviewed. New pneumonia identified. Will initiate Vanc. New fluid collection suspicious for pseudocyst. Will monitor for now on current treatment. If fever doesn't subside, will get surgical opinion.   DVT Prophylaxis: SCD's Code Status: Full Code  Family Communication: discussed with the patient   Disposition Plan: Patient is stable. Continue to monitor  closely. Continue stepdown for now.     LOS: 4 days   [facility]  [facility] Pager [phone] 10/25/2015, 7:34 AM  If 7PM-7AM, please contact night-coverage at [email or web address], password [facility]

## 2015-10-25 NOTE — Progress Notes (Signed)
ANTIBIOTIC CONSULT NOTE  Pharmacy Consult for Vancomycin and Primaxin Indication: Sepsis  No Known Allergies  Labs:  Recent Labs  10/22/15 1725  10/23/15 0542 10/23/15 1245 10/24/15 0730 10/25/15 0402  WBC  --   --  9.0  --  7.6 9.0  HGB  --   --  13.1  --  10.5* 11.2*  PLT  --   < > 54*  --  46* 70*  LABCREA 284.68  --   --   --   --   --   CREATININE  --   < > 4.21* 4.38* 4.64* 4.79*  < > = values in this interval not displayed. Estimated Creatinine Clearance: 28.6 mL/min (by C-G formula based on Cr of 4.79). No results for input(s): VANCOTROUGH, VANCOPEAK, VANCORANDOM, GENTTROUGH, GENTPEAK, GENTRANDOM, TOBRATROUGH, TOBRAPEAK, TOBRARND, AMIKACINPEAK, AMIKACINTROU, AMIKACIN in the last 72 hours.   Microbiology: Recent Results (from the past 720 hour(s))  Culture, blood (routine x 2)     Status: None (Preliminary result)   Collection Time: 10/22/15  3:35 PM  Result Value Ref Range Status   Specimen Description BLOOD RIGHT ARM  Final   Special Requests IN PEDIATRIC BOTTLE  2CC  Final   Culture NO GROWTH 3 DAYS  Final   Report Status PENDING  Incomplete  Culture, blood (routine x 2)     Status: None (Preliminary result)   Collection Time: 10/22/15  3:40 PM  Result Value Ref Range Status   Specimen Description BLOOD RIGHT HAND  Final   Special Requests BOTTLES DRAWN AEROBIC ONLY  4CC  Final   Culture NO GROWTH 3 DAYS  Final   Report Status PENDING  Incomplete  Culture, Urine     Status: None   Collection Time: 10/22/15  5:25 PM  Result Value Ref Range Status   Specimen Description URINE, CLEAN CATCH  Final   Special Requests NONE  Final   Culture 1,000 COLONIES/mL INSIGNIFICANT GROWTH  Final   Report Status 10/23/2015 FINAL  Final  MRSA PCR Screening     Status: None   Collection Time: 10/22/15 10:21 PM  Result Value Ref Range Status   MRSA by PCR NEGATIVE NEGATIVE Final    Comment:        The GeneXpert MRSA Assay (FDA approved for NASAL specimens only), is one  component of a comprehensive MRSA colonization surveillance program. It is not intended to diagnose MRSA infection nor to guide or monitor treatment for MRSA infections.     Assessment: [age] YOM with history of alcohol abuse who presented with epigastric pain with nausea. SIRS with concern for occult sepsis- pancreatic pseudocysts vs necrotizing pancreatitis. WBC normal, still febrile  Primaxin 11/16>> Vanc 11/16 x1  11/19>  11/16 BCx: NGTD 11/16 urine: insig growth  SCr up to 4.79, eCrCl ~30-7435mL/min currently using adjusted BW, normalized CrCL ~3530mL/min.  Goal of Therapy:  Vancomycin trough level 15-20 mcg/ml  Plan:  Continue Primaxin 250 mg iv Q 6 hours Restarting Vancomycin today -> 2 gram loading dose then 1500 mg iv Q 48 hours Continue to follow culture, fever trend, progress  Thank you Okey RegalLisa Vernisha Bacote, PharmD [phone] -10/25/2015 4:04 PM

## 2015-10-25 NOTE — Progress Notes (Signed)
Spoke with pt's mom Larry Maldonado and she stated pt does not have a home at this time, she states address listed is her and her husband's address and that pt cannot live there. She states pt works at [employer] and has a car. She states pt drinks half a bottle of vodka per day but he will not admit that. SW consult is ordered. Marisue Ivanobyn Jalene Demo RN

## 2015-10-26 ENCOUNTER — Inpatient Hospital Stay (HOSPITAL_COMMUNITY): Payer: Self-pay

## 2015-10-26 DIAGNOSIS — E538 Deficiency of other specified B group vitamins: Secondary | ICD-10-CM

## 2015-10-26 DIAGNOSIS — J189 Pneumonia, unspecified organism: Secondary | ICD-10-CM

## 2015-10-26 DIAGNOSIS — R609 Edema, unspecified: Secondary | ICD-10-CM

## 2015-10-26 LAB — COMPREHENSIVE METABOLIC PANEL
ALBUMIN: 1.9 g/dL — AB (ref 3.5–5.0)
ALT: 11 U/L — ABNORMAL LOW (ref 17–63)
ANION GAP: 12 (ref 5–15)
AST: 25 U/L (ref 15–41)
Alkaline Phosphatase: 61 U/L (ref 38–126)
BILIRUBIN TOTAL: 0.8 mg/dL (ref 0.3–1.2)
BUN: 61 mg/dL — ABNORMAL HIGH (ref 6–20)
CHLORIDE: 105 mmol/L (ref 101–111)
CO2: 19 mmol/L — ABNORMAL LOW (ref 22–32)
Calcium: 7.8 mg/dL — ABNORMAL LOW (ref 8.9–10.3)
Creatinine, Ser: 4.09 mg/dL — ABNORMAL HIGH (ref 0.61–1.24)
GFR calc Af Amer: 20 mL/min — ABNORMAL LOW (ref >60)
GFR calc non Af Amer: 17 mL/min — ABNORMAL LOW (ref >60)
GLUCOSE: 88 mg/dL (ref 65–99)
POTASSIUM: 3.5 mmol/L (ref 3.5–5.1)
Sodium: 136 mmol/L (ref 135–145)
TOTAL PROTEIN: 5.9 g/dL — AB (ref 6.5–8.1)

## 2015-10-26 LAB — CBC
HEMATOCRIT: 30.1 % — AB (ref 39.0–52.0)
Hemoglobin: 10.7 g/dL — ABNORMAL LOW (ref 13.0–17.0)
MCH: 36.5 pg — ABNORMAL HIGH (ref 26.0–34.0)
MCHC: 35.5 g/dL (ref 30.0–36.0)
MCV: 102.7 fL — ABNORMAL HIGH (ref 78.0–100.0)
PLATELETS: 143 10*3/uL — AB (ref 150–400)
RBC: 2.93 MIL/uL — ABNORMAL LOW (ref 4.22–5.81)
RDW: 15.1 % (ref 11.5–15.5)
WBC: 10.4 10*3/uL (ref 4.0–10.5)

## 2015-10-26 LAB — PHOSPHORUS: PHOSPHORUS: 5.1 mg/dL — AB (ref 2.5–4.6)

## 2015-10-26 LAB — PROCALCITONIN: PROCALCITONIN: 5.9 ng/mL

## 2015-10-26 MED ORDER — CYANOCOBALAMIN 1000 MCG/ML IJ SOLN
1000.0000 ug | Freq: Every day | INTRAMUSCULAR | Status: DC
  Administered 2015-10-26 – 2015-10-30 (×3): 1000 ug via INTRAMUSCULAR
  Filled 2015-10-26 (×6): qty 1

## 2015-10-26 NOTE — Progress Notes (Signed)
Pt has developed a small knot under skin on right upper arm by PIV. PIV is not leaking and has good blood return and flushes well. Called IV team to place new IV in left arm and d/c'd right PIV. Called Dr Rito EhrlichKrishnan and doppler has been ordered. Larry Zeimet MyrickRN

## 2015-10-26 NOTE — Care Management Note (Signed)
Case Management Note  Patient Details  Name: Larry SchatzCalvin Maldonado MRN: [id number] Date of Birth: 1976-08-19  Subjective/Objective:                 Admitted with pancreatitis. Hx of etoh abuse. No DME. PTA independent with adl's.   Action/Plan: Return to home when medically stable. CM to f/u woth d/c needs.  Expected Discharge Date:                  Expected Discharge Plan:  Home/Self Care  In-House Referral:  Clinical Social Work (substance abuser/ etoh)  Discharge planning Services  CM Consult  Post Acute Care Choice:    Choice offered to:     DME Arranged:    DME Agency:     HH Arranged:    HH Agency:     Status of Service:  In process, will continue to follow  Medicare Important Message Given:    Date Medicare IM Given:    Medicare IM give by:    Date Additional Medicare IM Given:    Additional Medicare Important Message give by:     If discussed at Long Length of Stay Meetings, dates discussed:    Additional Comments: Pt evasive with prior living arrangements, states will live with parents for short period once d/c, CM unable get to specifics from pt. Pt plans to assume care for self @ d/c. Marland Kitchen. Gae GallopCole, Ramie Erman [place], [place],BSN,CM [phone] 10/26/2015, 1:31 PM

## 2015-10-26 NOTE — Progress Notes (Signed)
[facility] ROUNDING NOTE   Subjective:   Interval History:  Patient bathing this morning  Looks better  Worrisome CT scan   Objective:  Vital signs in last 24 hours:  Temp:  [97.3 F (36.3 C)-98.8 F (37.1 C)] 97.3 F (36.3 C) (11/20 0820) Pulse Rate:  [106-132] 110 (11/20 0710) Resp:  [20-30] 28 (11/20 0819) BP: (120-141)/(76-97) 141/92 mmHg (11/20 0710) SpO2:  [95 %-100 %] 99 % (11/20 0819)  Weight change:  Filed Weights   10/21/15 1134 10/21/15 1815 10/22/15 1425  Weight: 106.595 kg (235 lb) 106.595 kg (235 lb) 111.5 kg (245 lb 13 oz)    Intake/Output: I/O last 3 completed shifts: In: 73 [P.O.:660; I.V.:3300; IV Piggyback:1032] Out: 3575 [Urine:3575]   Intake/Output this shift:  Total I/O In: 837.9 [P.O.:480; I.V.:357.9] Out: 100 [Urine:100]  CVS- RRR RS- CTA ABD- Bowel sounds hypoactive and distended abdomen EXT- no edema   Basic Metabolic Panel:  Recent Labs Lab 10/21/15 1300  10/23/15 0542 10/23/15 1245 10/24/15 0730 10/25/15 0402 10/26/15 0509  NA 135  < > 140 139 137 137 136  K 3.2*  < > 4.0 3.9 3.4* 3.7 3.5  CL 97*  < > 108 110 106 103 105  CO2 27  < > 20* 18* 20* 19* 19*  GLUCOSE 162*  < > 82 93 94 90 88  BUN 11  < > 32* 37* 45* 54* 61*  CREATININE 1.67*  < > 4.21* 4.38* 4.64* 4.79* 4.09*  CALCIUM 8.5*  < > 7.1* 6.7* 6.9* 7.5* 7.8*  MG 1.4*  --   --   --   --   --   --   PHOS  --   --   --   --  3.2 4.4 5.1*  < > = values in this interval not displayed.  Liver Function Tests:  Recent Labs Lab 10/22/15 0358 10/23/15 0542 10/23/15 1245 10/24/15 0730 10/25/15 0402 10/26/15 0509  AST 56* 44* 39  --  30 25  ALT 36 25 21  --  17 11*  ALKPHOS 43 58 44  --  61 61  BILITOT 1.6* 1.5* 1.4*  --  1.0 0.8  PROT 5.5* 5.6* 5.1*  --  6.3* 5.9*  ALBUMIN 2.5* 2.2* 2.0* 1.9* 2.3* 1.9*    Recent Labs Lab 10/21/15 1300 10/22/15 0358 10/23/15 0542 10/25/15 0402  LIPASE 1924* 1145* 149* 116*   No results for input(s):  AMMONIA in the last 168 hours.  CBC:  Recent Labs Lab 10/21/15 1300 10/22/15 0358 10/22/15 1829 10/23/15 0542 10/24/15 0730 10/25/15 0402 10/26/15 0509  WBC 8.9 9.1  --  9.0 7.6 9.0 10.4  NEUTROABS 7.3  --   --   --   --   --   --   HGB 14.0 13.0  --  13.1 10.5* 11.2* 10.7*  HCT 40.6 36.9*  --  37.8* 30.2* 33.0* 30.1*  MCV 106.0* 105.1*  --  105.3* 105.2* 105.4* 102.7*  PLT 186 80* 72* 54* 46* 70* 143*    Cardiac Enzymes:  Recent Labs Lab 10/23/15 0542  CKTOTAL 107    BNP: Invalid input(s): POCBNP  CBG: No results for input(s): GLUCAP in the last 168 hours.  Microbiology: Results for orders placed or performed during the hospital encounter of 10/21/15  Culture, blood (routine x 2)     Status: None (Preliminary result)   Collection Time: 10/22/15  3:35 PM  Result Value Ref Range Status   Specimen Description BLOOD RIGHT  ARM  Final   Special Requests IN PEDIATRIC BOTTLE  2CC  Final   Culture NO GROWTH 3 DAYS  Final   Report Status PENDING  Incomplete  Culture, blood (routine x 2)     Status: None (Preliminary result)   Collection Time: 10/22/15  3:40 PM  Result Value Ref Range Status   Specimen Description BLOOD RIGHT HAND  Final   Special Requests BOTTLES DRAWN AEROBIC ONLY  4CC  Final   Culture NO GROWTH 3 DAYS  Final   Report Status PENDING  Incomplete  Culture, Urine     Status: None   Collection Time: 10/22/15  5:25 PM  Result Value Ref Range Status   Specimen Description URINE, CLEAN CATCH  Final   Special Requests NONE  Final   Culture 1,000 COLONIES/mL INSIGNIFICANT GROWTH  Final   Report Status 10/23/2015 FINAL  Final  MRSA PCR Screening     Status: None   Collection Time: 10/22/15 10:21 PM  Result Value Ref Range Status   MRSA by PCR NEGATIVE NEGATIVE Final    Comment:        The GeneXpert MRSA Assay (FDA approved for NASAL specimens only), is one component of a comprehensive MRSA colonization surveillance program. It is not intended to  diagnose MRSA infection nor to guide or monitor treatment for MRSA infections.     Coagulation Studies: No results for input(s): LABPROT, INR in the last 72 hours.  Urinalysis: No results for input(s): COLORURINE, LABSPEC, PHURINE, GLUCOSEU, HGBUR, BILIRUBINUR, KETONESUR, PROTEINUR, UROBILINOGEN, NITRITE, LEUKOCYTESUR in the last 72 hours.  Invalid input(s): APPERANCEUR    Imaging: Ct Abdomen Pelvis Wo Contrast  10/25/2015  CLINICAL DATA:  Fever. Follow-up of acute pancreatitis. Acute renal failure. EXAM: CT ABDOMEN AND PELVIS WITHOUT CONTRAST TECHNIQUE: Multidetector CT imaging of the abdomen and pelvis was performed following the standard protocol without IV contrast. COMPARISON:  10/21/2015 FINDINGS: Lower chest: New bibasilar airspace disease. Normal heart size. Small bilateral pleural effusions, increased on the left and new on the right. Hepatobiliary: Mild hepatic steatosis. Right hepatic lobe low-density lesion is likely a cyst. Probable vicarious excretion of contrast by the gallbladder. No biliary duct dilatation. Pancreas: Pancreatic and peripancreatic edema is slightly increased. Low-density focus within the pancreatic body is again identified at 8 mm (image 44, series 2). Edema and fluid extending into the anterior para renal space. No well-defined pseudocyst. There is fluid adjacent to the anterior aspect of the pancreatic neck which measures 2.6 cm on image 40 and is new. Spleen: Normal in size, without focal abnormality. Adrenals/Urinary Tract: Normal adrenal glands. Retained contrast within the kidneys, consistent with a history of renal insufficiency. No hydronephrosis. Normal urinary bladder. Stomach/Bowel: Normal stomach, without wall thickening. Normal colon, appendix, and terminal ileum. Normal small bowel caliber. Vascular/Lymphatic: Normal caliber of the aorta and branch vessels. No abdominopelvic adenopathy. Reproductive: Normal prostate. Other: Trace left abdominal  ascites is similar.  Anasarca is new. Musculoskeletal: Thoracolumbar spondylosis. IMPRESSION: 1. New bibasilar airspace disease, consistent with pneumonia. 2. New and increased bilateral pleural effusions. 3. Slight progression of pancreatitis. Possible developing fluid collection anterior to the pancreatic neck could represent early pseudocyst. 4. No acute complication identified. 5. Hepatic steatosis. Electronically Signed   By: Jeronimo GreavesKyle  Talbot M.D.   On: 10/25/2015 14:46     Medications:   . sodium chloride 75 mL/hr at 10/26/15 1000   . cyanocobalamin  1,000 mcg Intramuscular Daily  . folic acid  1 mg Oral Daily  . HYDROmorphone  Intravenous 6 times per day  . imipenem-cilastatin  250 mg Intravenous 4 times per day  . LORazepam  0-4 mg Intravenous Q12H  . multivitamin with minerals  1 tablet Oral Daily  . sodium chloride  3 mL Intravenous Q12H  . thiamine  100 mg Oral Daily   Or  . thiamine  100 mg Intravenous Daily  . [START ON 10/27/2015] vancomycin  1,500 mg Intravenous Q48H   acetaminophen, fentaNYL (SUBLIMAZE) injection, ondansetron (ZOFRAN) IV  Assessment/ Plan:  39yo BM admitted 10/21/15 For abd pain and pancreatitis. Denies any hx of CKD and says his BP is variable but has never been treated with antiHTN but also has not seen a doctor in a long while. No baseline Scr but Scr on admission was 1.6. He received CT with contrast on 11/15 and repeated 11/19 Since admission Scr has progressively decreased  Acute kidney Injury  Creatinine is stable -- most likely ATN  Monitor serial renal panels  Is and Os  Great urine output Continue IV fluids  Volume  Probable ileus and 3rd spacing - Positive fluid balance  Continue IVF  Although good urine output without lasix which was stopped 11/19 Hypokalemia  Repleted        Monitor and replete Hypocalcemia  Secondary to pancreatitis   Corrected calcium is normal range Pancreatitis   Started Imipenum  Repeat CT scan showed possible early  Pseudocyst development   LOS: 5 Beckam Abdulaziz W  :34 AM

## 2015-10-26 NOTE — Progress Notes (Signed)
VASCULAR LAB PRELIMINARY  PRELIMINARY  PRELIMINARY  PRELIMINARY  Right upper extremity venous duplex completed.    Preliminary report:  There is no DVT or SVT noted in the right upper extremity.   Terryn Rosenkranz, RVT 10/26/2015, 4:42 PM

## 2015-10-26 NOTE — Progress Notes (Signed)
[facility] PROGRESS NOTE  Larry Maldonado ZOX:[id number] DOB: 1976/07/04 DOA: 10/21/2015  PCP: No PCP Per Patient  Brief HPI: 39 year old African-American male with no significant past medical history presented with epigastric abdominal pain with nausea. Patient consumes significant quantities of alcohol on daily basis including beer and Vodka. He was found to have acute pancreatitis. He was hospitalized for further management. Patient was noted to be tachycardic. He was noted also to be going through alcohol withdrawal. He was subsequently transferred to stepdown unit. Patient subsequently developed high fevers. He was placed on imipenem. Slowly improving.  Past medical history:  Past Medical History  Diagnosis Date  . Hypertension   . Chronic lower back pain   . Acute alcoholic pancreatitis 10/21/2015  . Alcohol related seizure (HCC) 04/2015    [name]/notes 10/21/2015    Consultants:  critical care medicine has signed off,  nephrology  Procedures: none  Antibiotics: Imipenem 11/16 Vancomycin added 11/19  Subjective: Patient feels much better this morning. Pain in the abdomen is much improved. 2-3 out of 10 in intensity. Denies any nausea, vomiting. Continues to make a lot of urine, per patient. Requesting something to eat or drink.  Objective: Vital Signs  Filed Vitals:   10/25/15 1914 10/25/15 2322 10/25/15 2327 10/26/15 0332  BP: 122/77 128/87  130/82  Pulse: 132 122  116  Temp: 97.4 F (36.3 C) 98.4 F (36.9 C)  98.3 F (36.8 C)  TempSrc: Oral Oral  Oral  Resp: 30 26  28   Height:      Weight:      SpO2: 97% 95% 96% 100%    Intake/Output Summary (Last 24 hours) at 10/26/15 0721 Last data filed at 10/26/15 0600  Gross per 24 hour  Intake   3826 ml  Output   2550 ml  Net   1276 ml   Filed Weights   10/21/15 1134 10/21/15 1815 10/22/15 1425  Weight: 106.595 kg (235 lb) 106.595 kg (235 lb) 111.5 kg (245 lb 13 oz)    General appearance: alert, cooperative,  appears stated age, no distress and tremulous Resp: Mildly diminished air entry at the bases without any wheezing, crackles. Cardio: S1, S2 is tachycardic, regular. No S3, S4. No rubs, murmurs, or bruit. No significant edema. GI: Abdomen is less tender. Less distended. No rebound, rigidity or guarding. No masses or organomegaly. Bowel sounds are present. Improved today. Neurologic: alert and oriented 3. Less Tremulous. No focal deficits.  Lab Results: Results for orders placed or performed during the hospital encounter of 10/21/15  Culture, blood (routine x 2)     Status: None (Preliminary result)   Collection Time: 10/22/15  3:35 PM  Result Value Ref Range Status   Specimen Description BLOOD RIGHT ARM  Final   Special Requests IN PEDIATRIC BOTTLE  2CC  Final   Culture NO GROWTH 3 DAYS  Final   Report Status PENDING  Incomplete  Culture, blood (routine x 2)     Status: None (Preliminary result)   Collection Time: 10/22/15  3:40 PM  Result Value Ref Range Status   Specimen Description BLOOD RIGHT HAND  Final   Special Requests BOTTLES DRAWN AEROBIC ONLY  4CC  Final   Culture NO GROWTH 3 DAYS  Final   Report Status PENDING  Incomplete  Culture, Urine     Status: None   Collection Time: 10/22/15  5:25 PM  Result Value Ref Range Status   Specimen Description URINE, CLEAN CATCH  Final   Special Requests  NONE  Final   Culture 1,000 COLONIES/mL INSIGNIFICANT GROWTH  Final   Report Status 10/23/2015 FINAL  Final  MRSA PCR Screening     Status: None   Collection Time: 10/22/15 10:21 PM  Result Value Ref Range Status   MRSA by PCR NEGATIVE NEGATIVE Final    Comment:        The GeneXpert MRSA Assay (FDA approved for NASAL specimens only), is one component of a comprehensive MRSA colonization surveillance program. It is not intended to diagnose MRSA infection nor to guide or monitor treatment for MRSA infections.     Basic Metabolic Panel:  Recent Labs Lab 10/21/15 1300   10/23/15 0542 10/23/15 1245 10/24/15 0730 10/25/15 0402 10/26/15 0509  NA 135  < > 140 139 137 137 136  K 3.2*  < > 4.0 3.9 3.4* 3.7 3.5  CL 97*  < > 108 110 106 103 105  CO2 27  < > 20* 18* 20* 19* 19*  GLUCOSE 162*  < > 82 93 94 90 88  BUN 11  < > 32* 37* 45* 54* 61*  CREATININE 1.67*  < > 4.21* 4.38* 4.64* 4.79* 4.09*  CALCIUM 8.5*  < > 7.1* 6.7* 6.9* 7.5* 7.8*  MG 1.4*  --   --   --   --   --   --   PHOS  --   --   --   --  3.2 4.4 5.1*  < > = values in this interval not displayed. Liver Function Tests:  Recent Labs Lab 10/22/15 0358 10/23/15 0542 10/23/15 1245 10/24/15 0730 10/25/15 0402 10/26/15 0509  AST 56* 44* 39  --  30 25  ALT 36 25 21  --  17 11*  ALKPHOS 43 58 44  --  61 61  BILITOT 1.6* 1.5* 1.4*  --  1.0 0.8  PROT 5.5* 5.6* 5.1*  --  6.3* 5.9*  ALBUMIN 2.5* 2.2* 2.0* 1.9* 2.3* 1.9*    Recent Labs Lab 10/21/15 1300 10/22/15 0358 10/23/15 0542 10/25/15 0402  LIPASE 1924* 1145* 149* 116*   CBC:  Recent Labs Lab 10/21/15 1300 10/22/15 0358 10/22/15 1829 10/23/15 0542 10/24/15 0730 10/25/15 0402 10/26/15 0509  WBC 8.9 9.1  --  9.0 7.6 9.0 10.4  NEUTROABS 7.3  --   --   --   --   --   --   HGB 14.0 13.0  --  13.1 10.5* 11.2* 10.7*  HCT 40.6 36.9*  --  37.8* 30.2* 33.0* 30.1*  MCV 106.0* 105.1*  --  105.3* 105.2* 105.4* 102.7*  PLT 186 80* 72* 54* 46* 70* 143*     Studies/Results: Ct Abdomen Pelvis Wo Contrast  10/25/2015  CLINICAL DATA:  Fever. Follow-up of acute pancreatitis. Acute renal failure. EXAM: CT ABDOMEN AND PELVIS WITHOUT CONTRAST TECHNIQUE: Multidetector CT imaging of the abdomen and pelvis was performed following the standard protocol without IV contrast. COMPARISON:  10/21/2015 FINDINGS: Lower chest: New bibasilar airspace disease. Normal heart size. Small bilateral pleural effusions, increased on the left and new on the right. Hepatobiliary: Mild hepatic steatosis. Right hepatic lobe low-density lesion is likely a cyst.  Probable vicarious excretion of contrast by the gallbladder. No biliary duct dilatation. Pancreas: Pancreatic and peripancreatic edema is slightly increased. Low-density focus within the pancreatic body is again identified at 8 mm (image 44, series 2). Edema and fluid extending into the anterior para renal space. No well-defined pseudocyst. There is fluid adjacent to the anterior aspect of the  pancreatic neck which measures 2.6 cm on image 40 and is new. Spleen: Normal in size, without focal abnormality. Adrenals/Urinary Tract: Normal adrenal glands. Retained contrast within the kidneys, consistent with a history of renal insufficiency. No hydronephrosis. Normal urinary bladder. Stomach/Bowel: Normal stomach, without wall thickening. Normal colon, appendix, and terminal ileum. Normal small bowel caliber. Vascular/Lymphatic: Normal caliber of the aorta and branch vessels. No abdominopelvic adenopathy. Reproductive: Normal prostate. Other: Trace left abdominal ascites is similar.  Anasarca is new. Musculoskeletal: Thoracolumbar spondylosis. IMPRESSION: 1. New bibasilar airspace disease, consistent with pneumonia. 2. New and increased bilateral pleural effusions. 3. Slight progression of pancreatitis. Possible developing fluid collection anterior to the pancreatic neck could represent early pseudocyst. 4. No acute complication identified. 5. Hepatic steatosis. Electronically Signed   By: Jeronimo Greaves M.D.   On: 10/25/2015 14:46    Medications:  Scheduled: . cyanocobalamin  1,000 mcg Intramuscular Daily  . folic acid  1 mg Oral Daily  . HYDROmorphone   Intravenous 6 times per day  . imipenem-cilastatin  250 mg Intravenous 4 times per day  . LORazepam  0-4 mg Intravenous Q12H  . multivitamin with minerals  1 tablet Oral Daily  . sodium chloride  3 mL Intravenous Q12H  . thiamine  100 mg Oral Daily   Or  . thiamine  100 mg Intravenous Daily  . [START ON 10/27/2015] vancomycin  1,500 mg Intravenous Q48H    Continuous: . sodium chloride 100 mL/hr at 10/26/15 0600   ZOX:[id number], fentaNYL (SUBLIMAZE) injection, ondansetron (ZOFRAN) IV  Assessment/Plan:  Principal Problem:   Acute alcoholic pancreatitis Active Problems:   ETOH abuse   Acute renal failure (HCC)   Alcohol withdrawal (HCC)   Thrombocytopenia (HCC)   Respiratory failure (HCC)   Pyrexia   Pancreatitis   Macrocytic anemia    Acute alcoholic pancreatitis Patient seems to be improving. Fever appears to be subsiding. Fever is most likely secondary to pancreatic inflammation. Repeat CT scan shows evidence for fluid collection which could be early signs of pseudocyst formation. Clinically, however, patient has improved today compared to yesterday. Initiate clear liquid diet. Continue imipenem for now. Vancomycin was added yesterday due to findings suggestive of pneumonia on CT scan. Cultures are negative so far.   Fever Most likely secondary to pancreatic inflammation. Could have also been secondary to pneumonia. Repeat CT scan as above. Fevers appear to be subsiding. Continue imipenem and vancomycin. Cultures are negative so far.  HCAP Noted on CT scan as discussed above. Continue current antibiotics. If cultures remain negative, transition to oral antibiotics in a day or 2.  Acute renal failure/hypokalemia/non anion gap metabolic acidosis Most likely secondary to contrast nephropathy. Nephrology is following. Patient was initially placed on IV fluids. He had poor urine output. He was subsequently started on high-dose Lasix. With this, patient started making large amounts of urine. Lasix was discontinued 11/19. Urine output continues to be excellent. Continue IV fluids but decrease the rate. Creatinine is better today. Monitor creatinine and urine output closely. Renal ultrasound does not show any hydronephrosis.   Sinus tachycardia Probably related to acute illness/pain/alcohol withdrawal. TSH is normal. EKG showed sinus  tachycardia. Heart rate appears to be improving. Continue to monitor closely.  Alcohol abuse with withdrawal symptoms Patient remained stable. Withdrawal symptoms are improving. Continue CIWA protocol. Thiamine, folic acid and multivitamins. 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   Thrombocytopenia Likely due to alcohol and acute illness. There could also have been an element  of DIC. No overt bleeding. Platelet counts are improving.   Macrocytic anemia TSH is normal. Drop in hemoglobin, likely dilutional. Anemia panel reviewed. Elevated ferritin, most likely due to inflammation. Vitamin B-12 noted to be low. Initiate intramuscular vitamin B-12.   Knot/swelling in Right Upper Arm Reported by RN. This was around the site of his IV excess. We'll obtain venous Doppler studies.   DVT Prophylaxis: SCD's Code Status: Full Code  Family Communication: discussed with the patient   Disposition Plan: Patient is improving. Initiate clear liquid diet as mentioned above. If he continues on this trajectory anticipate we'll be able to change him over to when necessary narcotics from his PCA. Will leave in step down unit for today. Anticipate transfer out to the floor tomorrow. Start mobilizing.    LOS: 5 days   [facility]  [facility] Pager [phone] 10/26/2015, 7:21 AM  If 7PM-7AM, please contact night-coverage at [email or web address], password [facility]

## 2015-10-27 ENCOUNTER — Inpatient Hospital Stay (HOSPITAL_COMMUNITY): Payer: Self-pay

## 2015-10-27 DIAGNOSIS — J9601 Acute respiratory failure with hypoxia: Secondary | ICD-10-CM

## 2015-10-27 LAB — CULTURE, BLOOD (ROUTINE X 2)
Culture: NO GROWTH
Culture: NO GROWTH

## 2015-10-27 LAB — RENAL FUNCTION PANEL
ANION GAP: 12 (ref 5–15)
Albumin: 1.8 g/dL — ABNORMAL LOW (ref 3.5–5.0)
BUN: 47 mg/dL — AB (ref 6–20)
CHLORIDE: 107 mmol/L (ref 101–111)
CO2: 17 mmol/L — ABNORMAL LOW (ref 22–32)
Calcium: 8 mg/dL — ABNORMAL LOW (ref 8.9–10.3)
Creatinine, Ser: 2.81 mg/dL — ABNORMAL HIGH (ref 0.61–1.24)
GFR calc Af Amer: 31 mL/min — ABNORMAL LOW (ref >60)
GFR calc non Af Amer: 27 mL/min — ABNORMAL LOW (ref >60)
GLUCOSE: 100 mg/dL — AB (ref 65–99)
PHOSPHORUS: 3.9 mg/dL (ref 2.5–4.6)
POTASSIUM: 2.9 mmol/L — AB (ref 3.5–5.1)
Sodium: 136 mmol/L (ref 135–145)

## 2015-10-27 LAB — CBC
HEMATOCRIT: 27.2 % — AB (ref 39.0–52.0)
HEMOGLOBIN: 9.6 g/dL — AB (ref 13.0–17.0)
MCH: 36.5 pg — ABNORMAL HIGH (ref 26.0–34.0)
MCHC: 35.3 g/dL (ref 30.0–36.0)
MCV: 103.4 fL — AB (ref 78.0–100.0)
Platelets: 212 10*3/uL (ref 150–400)
RBC: 2.63 MIL/uL — ABNORMAL LOW (ref 4.22–5.81)
RDW: 15.2 % (ref 11.5–15.5)
WBC: 13 10*3/uL — AB (ref 4.0–10.5)

## 2015-10-27 MED ORDER — POTASSIUM CHLORIDE CRYS ER 20 MEQ PO TBCR
40.0000 meq | EXTENDED_RELEASE_TABLET | Freq: Once | ORAL | Status: AC
  Administered 2015-10-27: 40 meq via ORAL
  Filled 2015-10-27: qty 2

## 2015-10-27 MED ORDER — FERUMOXYTOL INJECTION 510 MG/17 ML
510.0000 mg | Freq: Once | INTRAVENOUS | Status: AC
  Administered 2015-10-27: 510 mg via INTRAVENOUS
  Filled 2015-10-27: qty 17

## 2015-10-27 MED ORDER — LORAZEPAM 0.5 MG PO TABS: 0.5000 mg | ORAL_TABLET | ORAL | Status: DC | PRN

## 2015-10-27 MED ORDER — HYDROMORPHONE HCL 1 MG/ML IJ SOLN: 1.0000 mg | INTRAMUSCULAR | Status: DC | PRN

## 2015-10-27 MED ORDER — OXYCODONE HCL 5 MG PO TABS
5.0000 mg | ORAL_TABLET | ORAL | Status: DC | PRN
  Administered 2015-10-27 (×2): 5 mg via ORAL
  Filled 2015-10-27 (×2): qty 1

## 2015-10-27 MED ORDER — SODIUM BICARBONATE 650 MG PO TABS
1300.0000 mg | ORAL_TABLET | Freq: Three times a day (TID) | ORAL | Status: DC
  Administered 2015-10-27 (×2): 1300 mg via ORAL
  Administered 2015-10-27: 650 mg via ORAL
  Administered 2015-10-28: 1300 mg via ORAL
  Filled 2015-10-27 (×5): qty 2

## 2015-10-27 MED ORDER — CARVEDILOL 6.25 MG PO TABS
6.2500 mg | ORAL_TABLET | Freq: Two times a day (BID) | ORAL | Status: DC
  Administered 2015-10-27 – 2015-10-30 (×6): 6.25 mg via ORAL
  Filled 2015-10-27 (×6): qty 1

## 2015-10-27 MED ORDER — VANCOMYCIN HCL 10 G IV SOLR
1250.0000 mg | INTRAVENOUS | Status: DC
  Administered 2015-10-27 – 2015-10-28 (×2): 1250 mg via INTRAVENOUS
  Filled 2015-10-27 (×3): qty 1250

## 2015-10-27 MED ORDER — SODIUM BICARBONATE 650 MG PO TABS
650.0000 mg | ORAL_TABLET | Freq: Three times a day (TID) | ORAL | Status: DC
  Administered 2015-10-27: 650 mg via ORAL
  Filled 2015-10-27: qty 1

## 2015-10-27 NOTE — Progress Notes (Signed)
Called [facility] to ask for an update on patient's arrival time.  RN stated that the patient needed to be cleaned up.  Awaiting on patient arrival

## 2015-10-27 NOTE — Progress Notes (Signed)
CM spoke with pt @ bedside with mom and dad in attendance regarding interest in substance abuse treatment centers. Pt stated ok for CM to discuss with parents in the room. CM provided pt with information with listings of  Intensive Outpatient Programs and Residential Treatment Programs in the [place] area. Pt and parents requesting to speak to CSW, referral placed by CM. No other needs identified per CM @ present time. Gae Gallopngela Montie Gelardi RN,BSN,CM [phone]

## 2015-10-27 NOTE — Progress Notes (Signed)
UR COMPLETED  

## 2015-10-27 NOTE — Progress Notes (Addendum)
  Echocardiogram 2D Echocardiogram has been performed.  Janalyn HarderWest, Tina R 10/27/2015, 12:16 PM

## 2015-10-27 NOTE — Progress Notes (Signed)
Subjective: Interval History: has no complaint, to diet , abdm ok.  Objective: Vital signs in last 24 hours: Temp:  [98.1 F (36.7 C)-99.4 F (37.4 C)] 98.5 F (36.9 C) (11/21 0856) Pulse Rate:  [107-132] 112 (11/21 0800) Resp:  [19-41] 41 (11/21 0900) BP: (134-155)/(82-95) 134/85 mmHg (11/21 0800) SpO2:  [90 %-99 %] 97 % (11/21 0900) Weight change:   Intake/Output from previous day: 11/20 0701 - 11/21 0700 In: 3897.9 [P.O.:1440; I.V.:1857.9; IV Piggyback:600] Out: 1500 [Urine:1500] Intake/Output this shift: Total I/O In: 157.4 [I.V.:157.4] Out: 400 [Urine:400]  General appearance: alert, cooperative and pale Resp: clear to auscultation bilaterally Cardio: S1, S2 normal GI: mod distension, pos bs,  Extremities: edema 1+  Lab Results:  Recent Labs  10/26/15 0509 10/27/15 0330  WBC 10.4 13.0*  HGB 10.7* 9.6*  HCT 30.1* 27.2*  PLT 143* 212   BMET:  Recent Labs  10/26/15 0509 10/27/15 0330  NA 136 136  K 3.5 2.9*  CL 105 107  CO2 19* 17*  GLUCOSE 88 100*  BUN 61* 47*  CREATININE 4.09* 2.81*  CALCIUM 7.8* 8.0*   No results for input(s): PTH in the last 72 hours. Iron Studies:  Recent Labs  10/25/15 0402  IRON 12*  TIBC 171*  FERRITIN 1307*    Studies/Results: Ct Abdomen Pelvis Wo Contrast  10/25/2015  CLINICAL DATA:  Fever. Follow-up of acute pancreatitis. Acute renal failure. EXAM: CT ABDOMEN AND PELVIS WITHOUT CONTRAST TECHNIQUE: Multidetector CT imaging of the abdomen and pelvis was performed following the standard protocol without IV contrast. COMPARISON:  10/21/2015 FINDINGS: Lower chest: New bibasilar airspace disease. Normal heart size. Small bilateral pleural effusions, increased on the left and new on the right. Hepatobiliary: Mild hepatic steatosis. Right hepatic lobe low-density lesion is likely a cyst. Probable vicarious excretion of contrast by the gallbladder. No biliary duct dilatation. Pancreas: Pancreatic and peripancreatic edema is  slightly increased. Low-density focus within the pancreatic body is again identified at 8 mm (image 44, series 2). Edema and fluid extending into the anterior para renal space. No well-defined pseudocyst. There is fluid adjacent to the anterior aspect of the pancreatic neck which measures 2.6 cm on image 40 and is new. Spleen: Normal in size, without focal abnormality. Adrenals/Urinary Tract: Normal adrenal glands. Retained contrast within the kidneys, consistent with a history of renal insufficiency. No hydronephrosis. Normal urinary bladder. Stomach/Bowel: Normal stomach, without wall thickening. Normal colon, appendix, and terminal ileum. Normal small bowel caliber. Vascular/Lymphatic: Normal caliber of the aorta and branch vessels. No abdominopelvic adenopathy. Reproductive: Normal prostate. Other: Trace left abdominal ascites is similar.  Anasarca is new. Musculoskeletal: Thoracolumbar spondylosis. IMPRESSION: 1. New bibasilar airspace disease, consistent with pneumonia. 2. New and increased bilateral pleural effusions. 3. Slight progression of pancreatitis. Possible developing fluid collection anterior to the pancreatic neck could represent early pseudocyst. 4. No acute complication identified. 5. Hepatic steatosis. Electronically Signed   By: Jeronimo GreavesKyle  Talbot M.D.   On: 10/25/2015 14:46    I have reviewed the patient's current medications.  Assessment/Plan: 1 AKI improving,  Low K repleting,  Low bicarb  ^ po, can d/c ivf 2 Pancreatitis per primary 3 ETOH abuse 4 Low ptlt improvin 5 anemia low Fe replete P bicarb, K, Fe    LOS: 6 days   Halston Kintz L 10/27/2015,10:51 AM

## 2015-10-27 NOTE — Progress Notes (Signed)
ANTIBIOTIC CONSULT NOTE  Pharmacy Consult for Vancomycin and Primaxin Indication: PNA and pancreatitis No Known Allergies  Labs:  Recent Labs  10/25/15 0402 10/26/15 0509 10/27/15 0330  WBC 9.0 10.4 13.0*  HGB 11.2* 10.7* 9.6*  PLT 70* 143* 212  CREATININE 4.79* 4.09* 2.81*   Estimated Creatinine Clearance: 48.8 mL/min (by C-G formula based on Cr of 2.81). No results for input(s): VANCOTROUGH, VANCOPEAK, VANCORANDOM, GENTTROUGH, GENTPEAK, GENTRANDOM, TOBRATROUGH, TOBRAPEAK, TOBRARND, AMIKACINPEAK, AMIKACINTROU, AMIKACIN in the last 72 hours.   Microbiology: Recent Results (from the past 720 hour(s))  Culture, blood (routine x 2)     Status: None (Preliminary result)   Collection Time: 10/22/15  3:35 PM  Result Value Ref Range Status   Specimen Description BLOOD RIGHT ARM  Final   Special Requests IN PEDIATRIC BOTTLE  2CC  Final   Culture NO GROWTH 4 DAYS  Final   Report Status PENDING  Incomplete  Culture, blood (routine x 2)     Status: None (Preliminary result)   Collection Time: 10/22/15  3:40 PM  Result Value Ref Range Status   Specimen Description BLOOD RIGHT HAND  Final   Special Requests BOTTLES DRAWN AEROBIC ONLY  4CC  Final   Culture NO GROWTH 4 DAYS  Final   Report Status PENDING  Incomplete  Culture, Urine     Status: None   Collection Time: 10/22/15  5:25 PM  Result Value Ref Range Status   Specimen Description URINE, CLEAN CATCH  Final   Special Requests NONE  Final   Culture 1,000 COLONIES/mL INSIGNIFICANT GROWTH  Final   Report Status 10/23/2015 FINAL  Final  MRSA PCR Screening     Status: None   Collection Time: 10/22/15 10:21 PM  Result Value Ref Range Status   MRSA by PCR NEGATIVE NEGATIVE Final    Comment:        The GeneXpert MRSA Assay (FDA approved for NASAL specimens only), is one component of a comprehensive MRSA colonization surveillance program. It is not intended to diagnose MRSA infection nor to guide or monitor treatment for MRSA  infections.     Assessment: 39 yo M on imipenem and vancomycin per pharmacy. CT 11/19: c/w PNA, progression of pancreatitis; poss fluid collection; vanc added 11/19 due to PNA on CT scan.  AKI; SCr 1.67>3.2>4.21>4.38>4.79>4.09.>2.81; normalized creat cl 36; UOP 1.5 ml/kg/hr. WBC 10.4>13 tmax improved 99.1  Primaxin 11/16>> Vanc 11/16 x1  11/19>  11/16 BCx: NGTD 11/16 urine: insig growthF   Goal of Therapy:  Vancomycin trough level 15-20 mcg/ml  Plan:  Continue Primaxin 250 mg iv Q 6 hours Change vancomycin to 1250 mg q24 hrs Continue to follow culture, fever trend, progress  Herby AbrahamMichelle T. Romie Tay, Pharm.D. [phone] 10/27/2015 9:33 AM

## 2015-10-27 NOTE — Progress Notes (Signed)
NURSING PROGRESS NOTE  Larry SchatzCalvin Maldonado [id number] Transfer Data: 10/27/2015 4:38 PM Attending Provider: Osvaldo ShipperGokul Krishnan, MD PCP:No PCP Per Patient Code Status: full  Larry Maldonado is a 39 y.o. male patient transferred from 3S -No acute distress noted.  -No complaints of shortness of breath.  -No complaints of chest pain.   Cardiac Monitoring: Box # [id number] in place. Cardiac monitor yields:normal sinus rhythm.  Blood pressure 137/83, pulse 158, temperature 99 F (37.2 C), temperature source Oral, resp. rate 32, height 6\' 4"  (1.93 m), weight 111.5 kg (245 lb 13 oz), SpO2 79 %.   IV Fluids:  IV in place, occlusive dsg intact without redness, IV cath forearm left, condition patent and no redness none.   Allergies:  Review of patient's allergies indicates no known allergies.  Past Medical History:   has a past medical history of Hypertension; Chronic lower back pain; Acute alcoholic pancreatitis (10/21/2015); and Alcohol related seizure (HCC) (04/2015).  Past Surgical History:   has past surgical history that includes Achilles tendon repair (Left, 2004).  Social History:   reports that he has been smoking Cigars.  He has never used smokeless tobacco. He reports that he drinks about 21.0 oz of alcohol per week. He reports that he does not use illicit drugs.  Skin: bruising to the right leg  Patient/Family orientated to room. Information packet given to patient/family. Admission inpatient armband information verified with patient/family to include name and date of birth and placed on patient arm. Side rails up x 2, fall assessment and education completed with patient/family. Patient/family able to verbalize understanding of risk associated with falls and verbalized understanding to call for assistance before getting out of bed. Call light within reach. Patient/family able to voice and demonstrate understanding of unit orientation instructions.    Will continue to evaluate and treat per MD  orders.

## 2015-10-27 NOTE — Progress Notes (Signed)
Report received from 3S RN.  Awaiting patient arrival to unit

## 2015-10-27 NOTE — Progress Notes (Addendum)
[facility] PROGRESS NOTE  Larry Maldonado ZOX:[id number] DOB: 04-16-1976 DOA: 10/21/2015  PCP: No PCP Per Patient  Brief HPI: 39 year old African-American male with no significant past medical history presented with epigastric abdominal pain with nausea. Patient consumes significant quantities of alcohol on daily basis including beer and Vodka. He was found to have acute pancreatitis. He was hospitalized for further management. Patient was noted to be tachycardic. He was noted also to be going through alcohol withdrawal. He was subsequently transferred to stepdown unit. Patient subsequently developed high fevers. He was placed on imipenem. Repeat CT scan showed evidence for pneumonia as well. He was started on vancomycin.  Past medical history:  Past Medical History  Diagnosis Date  . Hypertension   . Chronic lower back pain   . Acute alcoholic pancreatitis 10/21/2015  . Alcohol related seizure (HCC) 04/2015    Larry Maldonado 10/21/2015    Consultants:  critical care medicine has signed off,  nephrology  Procedures: none  Antibiotics: Imipenem 11/16 Vancomycin added 11/19  Subjective: Patient continues to feel better. He tolerated his clear liquids yesterday. Did have some pain initially but that has resolved. No nausea, no vomiting. Overall feels much better. Continues to have good urine output.  Objective: Vital Signs  Filed Vitals:   10/26/15 2237 10/27/15 0000 10/27/15 0354 10/27/15 0355  BP:    149/82  Pulse:    107  Temp: 98.1 F (36.7 C)  99.1 F (37.3 C)   TempSrc: Oral  Oral   Resp:    31  Height:      Weight:      SpO2:  92%  93%    Intake/Output Summary (Last 24 hours) at 10/27/15 0738 Last data filed at 10/27/15 0600  Gross per 24 hour  Intake 3697.92 ml  Output   1500 ml  Net 2197.92 ml   Filed Weights   10/21/15 1134 10/21/15 1815 10/22/15 1425  Weight: 106.595 kg (235 lb) 106.595 kg (235 lb) 111.5 kg (245 lb 13 oz)    General appearance:  alert, cooperative, appears stated age, no distress and tremulous Resp: Improved air entry bilateral bases. No crackles or wheezing. Cardio: S1, S2 is tachycardic, regular. No S3, S4. No rubs, murmurs, or bruit. No significant edema. Heart rate is improved. GI: Abdomen is nontender today. Less distended. No rebound, rigidity or guarding. No masses or organomegaly. Bowel sounds are present.  Neurologic: alert and oriented 3. Less Tremulous. No focal deficits. Right arm was examined. No swelling noted.  Lab Results: Results for orders placed or performed during the hospital encounter of 10/21/15  Culture, blood (routine x 2)     Status: None (Preliminary result)   Collection Time: 10/22/15  3:35 PM  Result Value Ref Range Status   Specimen Description BLOOD RIGHT ARM  Final   Special Requests IN PEDIATRIC BOTTLE  2CC  Final   Culture NO GROWTH 4 DAYS  Final   Report Status PENDING  Incomplete  Culture, blood (routine x 2)     Status: None (Preliminary result)   Collection Time: 10/22/15  3:40 PM  Result Value Ref Range Status   Specimen Description BLOOD RIGHT HAND  Final   Special Requests BOTTLES DRAWN AEROBIC ONLY  4CC  Final   Culture NO GROWTH 4 DAYS  Final   Report Status PENDING  Incomplete  Culture, Urine     Status: None   Collection Time: 10/22/15  5:25 PM  Result Value Ref Range Status   Specimen Description  URINE, CLEAN CATCH  Final   Special Requests NONE  Final   Culture 1,000 COLONIES/mL INSIGNIFICANT GROWTH  Final   Report Status 10/23/2015 FINAL  Final  MRSA PCR Screening     Status: None   Collection Time: 10/22/15 10:21 PM  Result Value Ref Range Status   MRSA by PCR NEGATIVE NEGATIVE Final    Comment:        The GeneXpert MRSA Assay (FDA approved for NASAL specimens only), is one component of a comprehensive MRSA colonization surveillance program. It is not intended to diagnose MRSA infection nor to guide or monitor treatment for MRSA infections.      Basic Metabolic Panel:  Recent Labs Lab 10/21/15 1300  10/23/15 1245 10/24/15 0730 10/25/15 0402 10/26/15 0509 10/27/15 0330  NA 135  < > 139 137 137 136 136  K 3.2*  < > 3.9 3.4* 3.7 3.5 2.9*  CL 97*  < > 110 106 103 105 107  CO2 27  < > 18* 20* 19* 19* 17*  GLUCOSE 162*  < > 93 94 90 88 100*  BUN 11  < > 37* 45* 54* 61* 47*  CREATININE 1.67*  < > 4.38* 4.64* 4.79* 4.09* 2.81*  CALCIUM 8.5*  < > 6.7* 6.9* 7.5* 7.8* 8.0*  MG 1.4*  --   --   --   --   --   --   PHOS  --   --   --  3.2 4.4 5.1* 3.9  < > = values in this interval not displayed. Liver Function Tests:  Recent Labs Lab 10/22/15 0358 10/23/15 0542 10/23/15 1245 10/24/15 0730 10/25/15 0402 10/26/15 0509 10/27/15 0330  AST 56* 44* 39  --  30 25  --   ALT 36 25 21  --  17 11*  --   ALKPHOS 43 58 44  --  61 61  --   BILITOT 1.6* 1.5* 1.4*  --  1.0 0.8  --   PROT 5.5* 5.6* 5.1*  --  6.3* 5.9*  --   ALBUMIN 2.5* 2.2* 2.0* 1.9* 2.3* 1.9* 1.8*    Recent Labs Lab 10/21/15 1300 10/22/15 0358 10/23/15 0542 10/25/15 0402  LIPASE 1924* 1145* 149* 116*   CBC:  Recent Labs Lab 10/21/15 1300  10/23/15 0542 10/24/15 0730 10/25/15 0402 10/26/15 0509 10/27/15 0330  WBC 8.9  < > 9.0 7.6 9.0 10.4 13.0*  NEUTROABS 7.3  --   --   --   --   --   --   HGB 14.0  < > 13.1 10.5* 11.2* 10.7* 9.6*  HCT 40.6  < > 37.8* 30.2* 33.0* 30.1* 27.2*  MCV 106.0*  < > 105.3* 105.2* 105.4* 102.7* 103.4*  PLT 186  < > 54* 46* 70* 143* 212  < > = values in this interval not displayed.   Studies/Results: Ct Abdomen Pelvis Wo Contrast  10/25/2015  CLINICAL DATA:  Fever. Follow-up of acute pancreatitis. Acute renal failure. EXAM: CT ABDOMEN AND PELVIS WITHOUT CONTRAST TECHNIQUE: Multidetector CT imaging of the abdomen and pelvis was performed following the standard protocol without IV contrast. COMPARISON:  10/21/2015 FINDINGS: Lower chest: New bibasilar airspace disease. Normal heart size. Small bilateral pleural effusions,  increased on the left and new on the right. Hepatobiliary: Mild hepatic steatosis. Right hepatic lobe low-density lesion is likely a cyst. Probable vicarious excretion of contrast by the gallbladder. No biliary duct dilatation. Pancreas: Pancreatic and peripancreatic edema is slightly increased. Low-density focus within the pancreatic  body is again identified at 8 mm (image 44, series 2). Edema and fluid extending into the anterior para renal space. No well-defined pseudocyst. There is fluid adjacent to the anterior aspect of the pancreatic neck which measures 2.6 cm on image 40 and is new. Spleen: Normal in size, without focal abnormality. Adrenals/Urinary Tract: Normal adrenal glands. Retained contrast within the kidneys, consistent with a history of renal insufficiency. No hydronephrosis. Normal urinary bladder. Stomach/Bowel: Normal stomach, without wall thickening. Normal colon, appendix, and terminal ileum. Normal small bowel caliber. Vascular/Lymphatic: Normal caliber of the aorta and branch vessels. No abdominopelvic adenopathy. Reproductive: Normal prostate. Other: Trace left abdominal ascites is similar.  Anasarca is new. Musculoskeletal: Thoracolumbar spondylosis. IMPRESSION: 1. New bibasilar airspace disease, consistent with pneumonia. 2. New and increased bilateral pleural effusions. 3. Slight progression of pancreatitis. Possible developing fluid collection anterior to the pancreatic neck could represent early pseudocyst. 4. No acute complication identified. 5. Hepatic steatosis. Electronically Signed   By: Jeronimo Greaves M.D.   On: 10/25/2015 14:46    Medications:  Scheduled: . cyanocobalamin  1,000 mcg Intramuscular Daily  . folic acid  1 mg Oral Daily  . imipenem-cilastatin  250 mg Intravenous 4 times per day  . multivitamin with minerals  1 tablet Oral Daily  . potassium chloride  40 mEq Oral Once  . sodium chloride  3 mL Intravenous Q12H  . thiamine  100 mg Oral Daily   Or  . thiamine   100 mg Intravenous Daily  . vancomycin  1,500 mg Intravenous Q48H   Continuous: . sodium chloride 75 mL/hr at 10/27/15 0600   ZOX:[id number], HYDROmorphone (DILAUDID) injection, LORazepam, ondansetron (ZOFRAN) IV  Assessment/Plan:  Principal Problem:   Acute alcoholic pancreatitis Active Problems:   ETOH abuse   Acute renal failure (HCC)   Alcohol withdrawal (HCC)   Thrombocytopenia (HCC)   Respiratory failure (HCC)   Pyrexia   Pancreatitis   Macrocytic anemia   HCAP (healthcare-associated pneumonia)    Acute alcoholic pancreatitis Patient improving steadily. Fever appears to have subsided. Fever is most likely secondary to pancreatic inflammation. Repeat CT scan shows evidence for fluid collection which could be early signs of pseudocyst formation. Clinically, however, patient Tinea. She will improved. Tolerating his clear liquid diet. Continue imipenem for now. Vancomycin was added 11/19 due to findings suggestive of pneumonia on CT scan. Cultures are negative so far. Has not required his PCA in many hours. Change to as needed hydromorphone.  Fever Most likely secondary to pancreatic inflammation. Could have also been secondary to pneumonia. Continue imipenem and vancomycin. Cultures are negative so far. WBC noted to be elevated today. Unclear significance. Recheck tomorrow.  HCAP Noted on CT scan as discussed above. Continue current antibiotics. If cultures remain negative, transition to oral antibiotics in a day or 2.  Acute renal failure/hypokalemia/non anion gap metabolic acidosis Creatinine continues to improve. Renal failure was most likely secondary to contrast nephropathy. Nephrology is following. Patient was initially placed on IV fluids. He had poor urine output. He was subsequently given high-dose Lasix for about 24 hours. With this, patient started making large amounts of urine. Lasix was discontinued 11/19. Urine output continues to be excellent. Continue IV  fluids. Continue to monitor urine output closely. Renal ultrasound does not show any hydronephrosis. Replete potassium. Sodium bicarbonate.  Sinus tachycardia Probably related to acute illness/pain/alcohol withdrawal. TSH is normal. EKG showed sinus tachycardia. Heart rate appears to be improving. Heart rate does climb with activity. Consider getting an echocardiogram to  rule out structural heart problems.  Alcohol abuse with withdrawal symptoms Patient remained stable. Withdrawal symptoms are almost resolved. Continue CIWA protocol. Thiamine, folic acid and multivitamins. 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   Thrombocytopenia Likely due to alcohol Use and acute illness. There could also have been an element of DIC. No overt bleeding. Platelet counts are Improved.   Macrocytic anemia TSH is normal. Drop in hemoglobin, likely dilutional. Anemia panel reviewed. Elevated ferritin, most likely due to inflammation. Vitamin B-12 noted to be low. Initiated intramuscular vitamin B-12. Position to oral vitamin B-12 in 1-2 days.  Knot/swelling in Right Upper Arm Appears to have resolved. No DVT on Doppler studies.   DVT Prophylaxis: SCD's Code Status: Full Code  Family Communication: discussed with the patient   Disposition Plan: Continue clear liquid diet for today. Okay for transfer to telemetry today. Continue to mobilize.    LOS: 6 days   [facility]  [facility] Pager [phone] 10/27/2015, 7:38 AM  If 7PM-7AM, please contact night-coverage at [email or web address], password [facility]

## 2015-10-28 ENCOUNTER — Inpatient Hospital Stay (HOSPITAL_COMMUNITY): Payer: Self-pay

## 2015-10-28 DIAGNOSIS — I429 Cardiomyopathy, unspecified: Secondary | ICD-10-CM

## 2015-10-28 LAB — CBC
HEMATOCRIT: 26.9 % — AB (ref 39.0–52.0)
HEMOGLOBIN: 9.4 g/dL — AB (ref 13.0–17.0)
MCH: 36 pg — ABNORMAL HIGH (ref 26.0–34.0)
MCHC: 34.9 g/dL (ref 30.0–36.0)
MCV: 103.1 fL — AB (ref 78.0–100.0)
Platelets: 342 10*3/uL (ref 150–400)
RBC: 2.61 MIL/uL — AB (ref 4.22–5.81)
RDW: 15.3 % (ref 11.5–15.5)
WBC: 14.2 10*3/uL — AB (ref 4.0–10.5)

## 2015-10-28 LAB — COMPREHENSIVE METABOLIC PANEL
ALBUMIN: 1.7 g/dL — AB (ref 3.5–5.0)
ALK PHOS: 64 U/L (ref 38–126)
ALT: 13 U/L — ABNORMAL LOW (ref 17–63)
ANION GAP: 9 (ref 5–15)
AST: 34 U/L (ref 15–41)
BILIRUBIN TOTAL: 0.8 mg/dL (ref 0.3–1.2)
BUN: 32 mg/dL — AB (ref 6–20)
CALCIUM: 8 mg/dL — AB (ref 8.9–10.3)
CO2: 20 mmol/L — AB (ref 22–32)
Chloride: 109 mmol/L (ref 101–111)
Creatinine, Ser: 2.12 mg/dL — ABNORMAL HIGH (ref 0.61–1.24)
GFR calc Af Amer: 44 mL/min — ABNORMAL LOW (ref >60)
GFR calc non Af Amer: 38 mL/min — ABNORMAL LOW (ref >60)
GLUCOSE: 108 mg/dL — AB (ref 65–99)
Potassium: 2.8 mmol/L — ABNORMAL LOW (ref 3.5–5.1)
SODIUM: 138 mmol/L (ref 135–145)
TOTAL PROTEIN: 5 g/dL — AB (ref 6.5–8.1)

## 2015-10-28 LAB — PHOSPHORUS: Phosphorus: 3.7 mg/dL (ref 2.5–4.6)

## 2015-10-28 MED ORDER — POTASSIUM CHLORIDE CRYS ER 20 MEQ PO TBCR
40.0000 meq | EXTENDED_RELEASE_TABLET | Freq: Once | ORAL | Status: AC
  Administered 2015-10-28: 40 meq via ORAL
  Filled 2015-10-28: qty 2

## 2015-10-28 MED ORDER — SODIUM CHLORIDE 0.9 % IV SOLN
500.0000 mg | Freq: Three times a day (TID) | INTRAVENOUS | Status: DC
  Administered 2015-10-28 – 2015-10-30 (×6): 500 mg via INTRAVENOUS
  Filled 2015-10-28 (×10): qty 500

## 2015-10-28 MED ORDER — SODIUM BICARBONATE 650 MG PO TABS
1300.0000 mg | ORAL_TABLET | Freq: Two times a day (BID) | ORAL | Status: DC
  Administered 2015-10-28 – 2015-10-29 (×2): 1300 mg via ORAL
  Filled 2015-10-28 (×2): qty 2

## 2015-10-28 NOTE — Progress Notes (Signed)
OT Cancellation Note  Patient Details Name: Larry Maldonado MRN: [id number] DOB: [birth year]   Cancelled Treatment:    Reason Eval/Treat Not Completed:  (Potassium level at 2.8)  Earlie RavelingStraub, Jeury Mcnab L OTR/L [phone] 10/28/2015, 11:21 AM

## 2015-10-28 NOTE — Progress Notes (Signed)
ANTIBIOTIC CONSULT NOTE  Pharmacy Consult for Primaxin Indication: PNA and pancreatitis No Known Allergies  Labs:  Recent Labs  10/26/15 0509 10/27/15 0330 10/28/15 0531  WBC 10.4 13.0* 14.2*  HGB 10.7* 9.6* 9.4*  PLT 143* 212 342  CREATININE 4.09* 2.81* 2.12*   Estimated Creatinine Clearance: 64.6 mL/min (by C-G formula based on Cr of 2.12). No results for input(s): VANCOTROUGH, VANCOPEAK, VANCORANDOM, GENTTROUGH, GENTPEAK, GENTRANDOM, TOBRATROUGH, TOBRAPEAK, TOBRARND, AMIKACINPEAK, AMIKACINTROU, AMIKACIN in the last 72 hours.   Microbiology: Recent Results (from the past 720 hour(s))  Culture, blood (routine x 2)     Status: None   Collection Time: 10/22/15  3:35 PM  Result Value Ref Range Status   Specimen Description BLOOD RIGHT ARM  Final   Special Requests IN PEDIATRIC BOTTLE  2CC  Final   Culture NO GROWTH 5 DAYS  Final   Report Status 10/27/2015 FINAL  Final  Culture, blood (routine x 2)     Status: None   Collection Time: 10/22/15  3:40 PM  Result Value Ref Range Status   Specimen Description BLOOD RIGHT HAND  Final   Special Requests BOTTLES DRAWN AEROBIC ONLY  4CC  Final   Culture NO GROWTH 5 DAYS  Final   Report Status 10/27/2015 FINAL  Final  Culture, Urine     Status: None   Collection Time: 10/22/15  5:25 PM  Result Value Ref Range Status   Specimen Description URINE, CLEAN CATCH  Final   Special Requests NONE  Final   Culture 1,000 COLONIES/mL INSIGNIFICANT GROWTH  Final   Report Status 10/23/2015 FINAL  Final  MRSA PCR Screening     Status: None   Collection Time: 10/22/15 10:21 PM  Result Value Ref Range Status   MRSA by PCR NEGATIVE NEGATIVE Final    Comment:        The GeneXpert MRSA Assay (FDA approved for NASAL specimens only), is one component of a comprehensive MRSA colonization surveillance program. It is not intended to diagnose MRSA infection nor to guide or monitor treatment for MRSA infections.     Assessment: 39 yo M on  imipenem and vancomycin per pharmacy. CT 11/19: c/w PNA, progression of pancreatitis; poss fluid collection; vanc added 11/19 due to PNA on CT scan. Scr has improved to 2.12.   Primaxin 11/16>> Vanc 11/16 x1  11/19>  11/16 BCx - NEG 11/16 urine: insig growthF  Goal of Therapy:  Eradication of infection  Plan:  - Change primaxin 500mg  IV Q8H - F/u renal fxn, C&S, clinical status   Lysle Pearlachel Aamya Orellana, PharmD, BCPS Pager # [phone] 10/28/2015 8:46 AM

## 2015-10-28 NOTE — Progress Notes (Signed)
PT Cancellation Note  Patient Details Name: Larry Maldonado MRN: [id number] DOB: October 25, 1976   Cancelled Treatment:    Reason Eval/Treat Not Completed: Patient declined, no reason specified Pt declined participating in therapy. Reports he has been getting up without difficulties. Encouraged ambulation in hallway later today. "it is not that I can't do it physically, it is mental." Will follow up.   Larry Maldonado 10/28/2015, 4:59 PM  Larry Maldonado, PT, DPT [phone]

## 2015-10-28 NOTE — Progress Notes (Signed)
[facility] PROGRESS NOTE  Navi Erber WUJ:[id number] DOB: 1976-04-02 DOA: 10/21/2015  PCP: No PCP Per Patient  Brief HPI: 39 year old African-American male with no significant past medical history presented with epigastric abdominal pain with nausea. Patient consumes significant quantities of alcohol on daily basis including beer and Vodka. He was found to have acute pancreatitis. He was hospitalized for further management. Patient was noted to be tachycardic. He was noted also to be going through alcohol withdrawal. He was subsequently transferred to stepdown unit. Patient subsequently developed high fevers. He was placed on imipenem. Repeat CT scan showed evidence for pneumonia as well. He was started on vancomycin as well.  Past medical history:  Past Medical History  Diagnosis Date  . Hypertension   . Chronic lower back pain   . Acute alcoholic pancreatitis 10/21/2015  . Alcohol related seizure (HCC) 04/2015    [name] 10/21/2015    Consultants:  critical care medicine has signed off,  nephrology  Procedures:  Echocardiogram Study Conclusions - Left ventricle: The cavity size was normal. Wall thickness wasincreased in a pattern of mild LVH. Systolic function was mildlyto moderately reduced. The estimated ejection fraction was in therange of 40% to 45%. Diffuse hypokinesis. Abnormal strainpattern, GLS -13.7%. Doppler parameters are consistent withabnormal left ventricular relaxation (grade 1 diastolicdysfunction). - Aortic valve: There was no stenosis. - Aorta: Borderline aortic root dilation. Aortic root dimension: 37mm (ED). - Mitral valve: There was no significant regurgitation. - Right ventricle: Poorly visualized. The cavity size was normal.Systolic function was normal. - Tricuspid valve: Peak RV-RA gradient (S): 32 mm Hg. - Pulmonary arteries: PA peak pressure: 35 mm Hg (S). - Inferior vena cava: The vessel was normal in size. Therespirophasic diameter  changes were in the normal range (= 50%),consistent with normal central venous pressure. Impressions: - Normal LV size with mild LV hypertrophy. EF 40-45%, diffusehypokinesis. RV poorly visualized but probably normal. Nosignificant valvular abnormalities.  Antibiotics: Imipenem 11/16 Vancomycin added 11/19  Subjective: Patient continues to feel better. Still gets quite dyspneic on activity. Continues to tolerate his liquid diet. Denies any nausea or vomiting. Abdomen feels well. No pain.   Objective: Vital Signs  Filed Vitals:   10/27/15 1644 10/27/15 1747 10/27/15 2250 10/28/15 0600  BP: 134/88 146/92 124/62 147/81  Pulse: 114 117 104 98  Temp:  97.9 F (36.6 C) 98.7 F (37.1 C) 99.4 F (37.4 C)  TempSrc:  Oral Oral Oral  Resp:  Height:      Weight:      SpO2:  97% 97% 97%    Intake/Output Summary (Last 24 hours) at 10/28/15 1105 Last data filed at 10/28/15 1022  Gross per 24 hour  Intake    930 ml  Output   1025 ml  Net    -95 ml   Filed Weights   10/21/15 1134 10/21/15 1815 10/22/15 1425  Weight: 106.595 kg (235 lb) 106.595 kg (235 lb) 111.5 kg (245 lb 13 oz)    General appearance: alert, cooperative, appears stated age, no distress and tremulous Resp: Improved air entry bilateral bases. Some dullness to percussion is present. No crackles or wheezing. Cardio: S1, S2 is tachycardic, regular. Heart rate is much improved. No S3, S4. No rubs, murmurs, or bruit. No significant edema. GI: Abdomen remains benign. Soft. Less distended. No tenderness appreciated. No masses or organomegaly. Bowel sounds are present.  Neurologic: alert and oriented 3. Less Tremulous. No focal deficits. Right arm was examined. No swelling noted.  Lab  Results: Results for orders placed or performed during the hospital encounter of 10/21/15  Culture, blood (routine x 2)     Status: None   Collection Time: 10/22/15  3:35 PM  Result Value Ref Range Status   Specimen Description  BLOOD RIGHT ARM  Final   Special Requests IN PEDIATRIC BOTTLE  2CC  Final   Culture NO GROWTH 5 DAYS  Final   Report Status 10/27/2015 FINAL  Final  Culture, blood (routine x 2)     Status: None   Collection Time: 10/22/15  3:40 PM  Result Value Ref Range Status   Specimen Description BLOOD RIGHT HAND  Final   Special Requests BOTTLES DRAWN AEROBIC ONLY  4CC  Final   Culture NO GROWTH 5 DAYS  Final   Report Status 10/27/2015 FINAL  Final  Culture, Urine     Status: None   Collection Time: 10/22/15  5:25 PM  Result Value Ref Range Status   Specimen Description URINE, CLEAN CATCH  Final   Special Requests NONE  Final   Culture 1,000 COLONIES/mL INSIGNIFICANT GROWTH  Final   Report Status 10/23/2015 FINAL  Final  MRSA PCR Screening     Status: None   Collection Time: 10/22/15 10:21 PM  Result Value Ref Range Status   MRSA by PCR NEGATIVE NEGATIVE Final    Comment:        The GeneXpert MRSA Assay (FDA approved for NASAL specimens only), is one component of a comprehensive MRSA colonization surveillance program. It is not intended to diagnose MRSA infection nor to guide or monitor treatment for MRSA infections.     Basic Metabolic Panel:  Recent Labs Lab 10/21/15 1300  10/24/15 0730 10/25/15 0402 10/26/15 0509 10/27/15 0330 10/28/15 0531  NA 135  < > 137 137 136 136 138  K 3.2*  < > 3.4* 3.7 3.5 2.9* 2.8*  CL 97*  < > 106 103 105 107 109  CO2 27  < > 20* 19* 19* 17* 20*  GLUCOSE 162*  < > 94 90 88 100* 108*  BUN 11  < > 45* 54* 61* 47* 32*  CREATININE 1.67*  < > 4.64* 4.79* 4.09* 2.81* 2.12*  CALCIUM 8.5*  < > 6.9* 7.5* 7.8* 8.0* 8.0*  MG 1.4*  --   --   --   --   --   --   PHOS  --   --  3.2 4.4 5.1* 3.9 3.7  < > = values in this interval not displayed. Liver Function Tests:  Recent Labs Lab 10/23/15 0542 10/23/15 1245 10/24/15 0730 10/25/15 0402 10/26/15 0509 10/27/15 0330 10/28/15 0531  AST 44* 39  --  30 25  --  34  ALT 25 21  --  17 11*  --  13*    ALKPHOS 58 44  --  61 61  --  64  BILITOT 1.5* 1.4*  --  1.0 0.8  --  0.8  PROT 5.6* 5.1*  --  6.3* 5.9*  --  5.0*  ALBUMIN 2.2* 2.0* 1.9* 2.3* 1.9* 1.8* 1.7*    Recent Labs Lab 10/21/15 1300 10/22/15 0358 10/23/15 0542 10/25/15 0402  LIPASE 1924* 1145* 149* 116*   CBC:  Recent Labs Lab 10/21/15 1300  10/24/15 0730 10/25/15 0402 10/26/15 0509 10/27/15 0330 10/28/15 0531  WBC 8.9  < > 7.6 9.0 10.4 13.0* 14.2*  NEUTROABS 7.3  --   --   --   --   --   --  HGB 14.0  < > 10.5* 11.2* 10.7* 9.6* 9.4*  HCT 40.6  < > 30.2* 33.0* 30.1* 27.2* 26.9*  MCV 106.0*  < > 105.2* 105.4* 102.7* 103.4* 103.1*  PLT 186  < > 46* 70* 143* 212 342  < > = values in this interval not displayed.   Studies/Results: No results found.  Medications:  Scheduled: . carvedilol  6.25 mg Oral BID WC  . cyanocobalamin  1,000 mcg Intramuscular Daily  . folic acid  1 mg Oral Daily  . imipenem-cilastatin  500 mg Intravenous Q8H  . multivitamin with minerals  1 tablet Oral Daily  . potassium chloride  40 mEq Oral Once  . sodium bicarbonate  1,300 mg Oral TID  . sodium chloride  3 mL Intravenous Q12H  . thiamine  100 mg Oral Daily  . vancomycin  1,250 mg Intravenous Q24H   Continuous:   ZOX:[id number], HYDROmorphone (DILAUDID) injection, LORazepam, ondansetron (ZOFRAN) IV, oxyCODONE  Assessment/Plan:  Principal Problem:   Acute alcoholic pancreatitis Active Problems:   ETOH abuse   Acute renal failure (HCC)   Alcohol withdrawal (HCC)   Thrombocytopenia (HCC)   Respiratory failure (HCC)   Pyrexia   Pancreatitis   Macrocytic anemia   HCAP (healthcare-associated pneumonia)    Acute alcoholic pancreatitis Patient improving steadily. Fever appears to have subsided. Fever was most likely secondary to pancreatic inflammation. Repeat CT scan on 11/19 showed evidence for fluid collection which could be early signs of pseudocyst formation. Clinically, patient continues to improve. Advance him  to full liquid diet. WBC noted to be slightly more elevated today compared to yesterday. Continue imipenem and vancomycin for now. Vancomycin was initiated due to suggestion of pneumonia on CT scan. If WBC continues to increase, we may have to rescan his abdomen.   Fever Appears to be resolving. Most likely secondary to pancreatic inflammation. Could have also been secondary to pneumonia. Continue imipenem and vancomycin. Cultures are negative so far.   Leukocytosis See above as well. Continue to monitor WBC count. If it continues to increase, we may have to repeat CT scan.  HCAP Noted on CT scan as discussed above. Continue current antibiotics. Cultures are negative so far. However, continue current antibiotics for now due to increasing WBC. Repeat chest x-ray today due to dyspnea with exertion. He was noted to have pleural effusion on CT. Would like to see that has been any progression.  Acute renal failure/hypokalemia/non anion gap metabolic acidosis Creatinine continues to improve. Renal failure was most likely secondary to contrast nephropathy. Nephrology is following. Patient was initially placed on IV fluids. He had poor urine output. He was subsequently given high-dose Lasix for about 24 hours. With this, patient started making large amounts of urine. Lasix was discontinued 11/19. Urine output not as much in the last 24 hours. Creatinine, however, is improved. Renal ultrasound does not show any hydronephrosis. Replete potassium. Sodium bicarbonate.  Cardiomyopathy with Sinus tachycardia Echocardiogram shows EF of 40-45%. Patient does not have any known history of congestive heart failure or heart disease. EKG from a few days ago does not show any ischemic changes. Patient denies any chest pain. We will repeat EKG. If his urine output does not improve in the next 24 hours Diuretics should considered after discussion with nephrology. Diffuse hypokinesis noted on echocardiogram. This could all be  due to his acute illness. He may need outpatient cardiology evaluation. Patient was started on carvedilol. Sinus tachycardia was most likely related to acute illness/pain/alcohol withdrawal. TSH is normal.  EKG showed sinus tachycardia. Heart rate appears to be improving.   Alcohol abuse with withdrawal symptoms Patient is stable. He has improved. He no longer has any symptoms of alcohol withdrawal. Thiamine, folic acid and multivitamins. 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   Thrombocytopenia This has resolved. Was likely due to alcohol use and acute illness. There could also have been an element of DIC. No overt bleeding.   Macrocytic anemia TSH is normal. Drop in hemoglobin, likely dilutional. Anemia panel reviewed. Elevated ferritin, most likely due to inflammation. Vitamin B-12 noted to be low. Initiated intramuscular vitamin B-12. Transition to oral vitamin B-12 in 1-2 days.  Knot/swelling in Right Upper Arm Appears to have resolved. No DVT on Doppler studies.   DVT Prophylaxis: SCD's Code Status: Full Code  Family Communication: discussed with the patient   Disposition Plan: Advance to full liquids today. Continue to mobilize.     LOS: 7 days   [facility] Rehabilitation HospitalKRISHNAN,Natascha Edmonds  [facility] Pager [phone] 10/28/2015, 11:05 AM  If 7PM-7AM, please contact night-coverage at [email or web address], password [email or web address]

## 2015-10-28 NOTE — Progress Notes (Signed)
Subjective: Interval History: has complaints stom bloated.  Objective: Vital signs in last 24 hours: Temp:  [97.9 F (36.6 C)-99.4 F (37.4 C)] 99.4 F (37.4 C) (11/22 0600) Pulse Rate:  [98-158] 98 (11/22 0600) Resp:  [18-32] 19 (11/22 0600) BP: (124-147)/(62-92) 147/81 mmHg (11/22 0600) SpO2:  [79 %-97 %] 97 % (11/22 0600) Weight change:   Intake/Output from previous day: 11/21 0701 - 11/22 0700 In: 607.4 [I.V.:157.4; IV Piggyback:450] Out: 1275 [Urine:1275] Intake/Output this shift: Total I/O In: 480 [P.O.:480] Out: 150 [Urine:150]  General appearance: alert, cooperative and no distress Resp: clear to auscultation bilaterally Cardio: S1, S2 normal and systolic murmur: holosystolic 2/6, blowing at apex GI: mod distension, pos bs, soft Extremities: edema 1+  Lab Results:  Recent Labs  10/27/15 0330 10/28/15 0531  WBC 13.0* 14.2*  HGB 9.6* 9.4*  HCT 27.2* 26.9*  PLT 212 342   BMET:  Recent Labs  10/27/15 0330 10/28/15 0531  NA 136 138  K 2.9* 2.8*  CL 107 109  CO2 17* 20*  GLUCOSE 100* 108*  BUN 47* 32*  CREATININE 2.81* 2.12*  CALCIUM 8.0* 8.0*   No results for input(s): PTH in the last 72 hours. Iron Studies: No results for input(s): IRON, TIBC, TRANSFERRIN, FERRITIN in the last 72 hours.  Studies/Results: No results found.  I have reviewed the patient's current medications.  Assessment/Plan: 1  AKI improving , slow diuresis. Acid/base better, will replete K. Vol ok 2 Pancreatitis per primary 3 ETOH P lower dose bicarb, clear liq, follow chem, K    LOS: 7 days   Maeley Matton L 10/28/2015,11:44 AM

## 2015-10-28 NOTE — Care Management Note (Signed)
Case Management Note  Patient Details  Name: Frutoso SchatzCalvin Beigel MRN: [id number] Date of Birth: 07-Mar-1976  Subjective/Objective:                  Date-10-28-15 Initial Assessment Patient transferred from another 3S with in last 24 hours. CM anticipates patient to go home at time of discharge.  Patient has no DME. Expressed potential need for no other DME.  Patient uninsured at this time. [facility] entered in [facility]. Patient referred to CSW for substance abuse/ ETOH counseling.   Plan: CM will continue to follow for discharge planning and [facility].   Lawerance Sabalebbie Cerra Eisenhower RN BSN CM [phone]   Action/Plan:   Expected Discharge Date:                  Expected Discharge Plan:  Home/Self Care  In-House Referral:  Clinical Social Work (substance abuser/ etoh)  Discharge planning Services  CM Consult, [facility]  Post Acute Care Choice:    Choice offered to:     DME Arranged:    DME Agency:     HH Arranged:    HH Agency:     Status of Service:  Completed, signed off  Medicare Important Message Given:    Date Medicare IM Given:    Medicare IM give by:    Date Additional Medicare IM Given:    Additional Medicare Important Message give by:     If discussed at Long Length of Stay Meetings, dates discussed:    Additional Comments:  Lawerance SabalDebbie Baptiste Littler, RN 10/28/2015, 12:00 PM

## 2015-10-28 NOTE — Clinical Social Work Note (Signed)
CSW attempted to meet with patient regarding ETOH use and resources. Patient not currently in room. CSW will attempt to assess at a later time.   Roddie McBryant Adryanna Friedt MSW, [place]CS[place], [place], [phone]

## 2015-10-29 ENCOUNTER — Encounter (HOSPITAL_COMMUNITY): Payer: Self-pay | Admitting: General Surgery

## 2015-10-29 ENCOUNTER — Inpatient Hospital Stay (HOSPITAL_COMMUNITY): Payer: Self-pay

## 2015-10-29 LAB — RENAL FUNCTION PANEL
ANION GAP: 9 (ref 5–15)
Albumin: 1.8 g/dL — ABNORMAL LOW (ref 3.5–5.0)
BUN: 20 mg/dL (ref 6–20)
CHLORIDE: 112 mmol/L — AB (ref 101–111)
CO2: 22 mmol/L (ref 22–32)
CREATININE: 1.78 mg/dL — AB (ref 0.61–1.24)
Calcium: 8.3 mg/dL — ABNORMAL LOW (ref 8.9–10.3)
GFR, EST AFRICAN AMERICAN: 54 mL/min — AB (ref >60)
GFR, EST NON AFRICAN AMERICAN: 47 mL/min — AB (ref >60)
Glucose, Bld: 100 mg/dL — ABNORMAL HIGH (ref 65–99)
POTASSIUM: 3 mmol/L — AB (ref 3.5–5.1)
Phosphorus: 3.3 mg/dL (ref 2.5–4.6)
Sodium: 143 mmol/L (ref 135–145)

## 2015-10-29 LAB — LIPID PANEL
CHOLESTEROL: 94 mg/dL (ref 0–200)
HDL: 18 mg/dL — AB (ref >40)
LDL CALC: 48 mg/dL (ref 0–99)
TRIGLYCERIDES: 140 mg/dL (ref <150)
Total CHOL/HDL Ratio: 5.2 RATIO
VLDL: 28 mg/dL (ref 0–40)

## 2015-10-29 LAB — CBC WITH DIFFERENTIAL/PLATELET
BASOS PCT: 0 %
Basophils Absolute: 0 10*3/uL (ref 0.0–0.1)
EOS PCT: 1 %
Eosinophils Absolute: 0.2 10*3/uL (ref 0.0–0.7)
HEMATOCRIT: 27.8 % — AB (ref 39.0–52.0)
HEMOGLOBIN: 9.3 g/dL — AB (ref 13.0–17.0)
LYMPHS PCT: 9 %
Lymphs Abs: 1.5 10*3/uL (ref 0.7–4.0)
MCH: 35.2 pg — ABNORMAL HIGH (ref 26.0–34.0)
MCHC: 33.5 g/dL (ref 30.0–36.0)
MCV: 105.3 fL — AB (ref 78.0–100.0)
MONO ABS: 1.5 10*3/uL — AB (ref 0.1–1.0)
MONOS PCT: 9 %
NEUTROS PCT: 81 %
Neutro Abs: 13.9 10*3/uL — ABNORMAL HIGH (ref 1.7–7.7)
Platelets: 453 10*3/uL — ABNORMAL HIGH (ref 150–400)
RBC: 2.64 MIL/uL — AB (ref 4.22–5.81)
RDW: 15.3 % (ref 11.5–15.5)
WBC: 17.1 10*3/uL — AB (ref 4.0–10.5)

## 2015-10-29 LAB — MAGNESIUM: Magnesium: 2 mg/dL (ref 1.7–2.4)

## 2015-10-29 LAB — LIPASE, BLOOD: Lipase: 137 U/L — ABNORMAL HIGH (ref 11–51)

## 2015-10-29 MED ORDER — POTASSIUM CHLORIDE CRYS ER 20 MEQ PO TBCR
40.0000 meq | EXTENDED_RELEASE_TABLET | Freq: Three times a day (TID) | ORAL | Status: DC
  Administered 2015-10-29 (×2): 40 meq via ORAL
  Filled 2015-10-29 (×3): qty 2

## 2015-10-29 MED ORDER — FUROSEMIDE 10 MG/ML IJ SOLN
40.0000 mg | Freq: Once | INTRAMUSCULAR | Status: AC
  Administered 2015-10-29: 40 mg via INTRAVENOUS
  Filled 2015-10-29: qty 4

## 2015-10-29 MED ORDER — MAGNESIUM SULFATE 2 GM/50ML IV SOLN
2.0000 g | Freq: Once | INTRAVENOUS | Status: AC
  Administered 2015-10-29: 2 g via INTRAVENOUS
  Filled 2015-10-29: qty 50

## 2015-10-29 MED ORDER — POTASSIUM CHLORIDE 20 MEQ/15ML (10%) PO SOLN
40.0000 meq | Freq: Once | ORAL | Status: AC
  Administered 2015-10-29: 40 meq via ORAL
  Filled 2015-10-29: qty 30

## 2015-10-29 NOTE — Progress Notes (Addendum)
[facility] PROGRESS NOTE  Larry Maldonado RUE:[id number] DOB: [birth year] DOA: 10/21/2015  PCP: No PCP Per Patient  Brief HPI: 39 year old African-American male with no significant past medical history presented with epigastric abdominal pain with nausea. Patient consumes significant quantities of alcohol on daily basis including beer and Vodka. He was found to have acute pancreatitis. He was hospitalized for further management. Patient was noted to be tachycardic. He was noted also to be going through alcohol withdrawal. He was subsequently transferred to stepdown unit. Patient subsequently developed high fevers. He was placed on imipenem. Repeat CT scan showed evidence for pneumonia as well. He was started on vancomycin as well.  Past medical history:  Past Medical History  Diagnosis Date  . Hypertension   . Chronic lower back pain   . Acute alcoholic pancreatitis 10/21/2015  . Alcohol related seizure (HCC) 04/2015    [name]/notes 10/21/2015    Consultants:  critical care medicine has signed off,  nephrology  Procedures:  Echocardiogram Study Conclusions - Left ventricle: The cavity size was normal. Wall thickness wasincreased in a pattern of mild LVH. Systolic function was mildlyto moderately reduced. The estimated ejection fraction was in therange of 40% to 45%. Diffuse hypokinesis. Abnormal strainpattern, GLS -13.7%. Doppler parameters are consistent withabnormal left ventricular relaxation (grade 1 diastolicdysfunction). - Aortic valve: There was no stenosis. - Aorta: Borderline aortic root dilation. Aortic root dimension: 37mm (ED). - Mitral valve: There was no significant regurgitation. - Right ventricle: Poorly visualized. The cavity size was normal.Systolic function was normal. - Tricuspid valve: Peak RV-RA gradient (S): 32 mm Hg. - Pulmonary arteries: PA peak pressure: 35 mm Hg (S). - Inferior vena cava: The vessel was normal in size. Therespirophasic diameter  changes were in the normal range (= 50%),consistent with normal central venous pressure. Impressions: - Normal LV size with mild LV hypertrophy. EF 40-45%, diffusehypokinesis. RV poorly visualized but probably normal. Nosignificant valvular abnormalities.  Antibiotics: Imipenem 11/16 Vancomycin added 11/19  Subjective: Denies any nausea or vomiting. Abdomen feels well. No pain.   Still gets quite dyspneic on activity. Continues to tolerate his liquid diet.     Objective: Vital Signs  Filed Vitals:   10/28/15 1752 10/28/15 2115 10/29/15 0226 10/29/15 0516  BP: 153/79 125/72  128/75  Pulse: 91 92  87  Temp:  100 F (37.8 C) 99.3 F (37.4 C) 99.3 F (37.4 C)  TempSrc:  Oral Oral Oral  Resp: 18 15  16   Height:      Weight:      SpO2: 99% 98%  98%    Intake/Output Summary (Last 24 hours) at 10/29/15 0909 Last data filed at 10/29/15 0829  Gross per 24 hour  Intake    203 ml  Output    975 ml  Net   -772 ml   Filed Weights   10/21/15 1134 10/21/15 1815 10/22/15 1425  Weight: 106.595 kg (235 lb) 106.595 kg (235 lb) 111.5 kg (245 lb 13 oz)    General appearance: alert, cooperative, appears stated age, no distress and tremulous Resp: Improved air entry bilateral bases. Some dullness to percussion is present. No crackles or wheezing. Cardio: S1, S2 is tachycardic, regular. Heart rate is much improved. No S3, S4. No rubs, murmurs, or bruit. No significant edema. GI: Abdomen remains benign. Soft. Less distended. No tenderness appreciated. No masses or organomegaly. Bowel sounds are present.  Neurologic: alert and oriented 3. Less Tremulous. No focal deficits. Right arm was examined. No swelling noted.  Lab Results:  Results for orders placed or performed during the hospital encounter of 10/21/15  Culture, blood (routine x 2)     Status: None   Collection Time: 10/22/15  3:35 PM  Result Value Ref Range Status   Specimen Description BLOOD RIGHT ARM  Final   Special  Requests IN PEDIATRIC BOTTLE  2CC  Final   Culture NO GROWTH 5 DAYS  Final   Report Status 10/27/2015 FINAL  Final  Culture, blood (routine x 2)     Status: None   Collection Time: 10/22/15  3:40 PM  Result Value Ref Range Status   Specimen Description BLOOD RIGHT HAND  Final   Special Requests BOTTLES DRAWN AEROBIC ONLY  4CC  Final   Culture NO GROWTH 5 DAYS  Final   Report Status 10/27/2015 FINAL  Final  Culture, Urine     Status: None   Collection Time: 10/22/15  5:25 PM  Result Value Ref Range Status   Specimen Description URINE, CLEAN CATCH  Final   Special Requests NONE  Final   Culture 1,000 COLONIES/mL INSIGNIFICANT GROWTH  Final   Report Status 10/23/2015 FINAL  Final  MRSA PCR Screening     Status: None   Collection Time: 10/22/15 10:21 PM  Result Value Ref Range Status   MRSA by PCR NEGATIVE NEGATIVE Final    Comment:        The GeneXpert MRSA Assay (FDA approved for NASAL specimens only), is one component of a comprehensive MRSA colonization surveillance program. It is not intended to diagnose MRSA infection nor to guide or monitor treatment for MRSA infections.     Basic Metabolic Panel:  Recent Labs Lab 10/25/15 0402 10/26/15 0509 10/27/15 0330 10/28/15 0531 10/29/15 0401  NA 137 136 136 138 143  K 3.7 3.5 2.9* 2.8* 3.0*  CL 103 105 107 109 112*  CO2 19* 19* 17* 20* 22  GLUCOSE 90 88 100* 108* 100*  BUN 54* 61* 47* 32* 20  CREATININE 4.79* 4.09* 2.81* 2.12* 1.78*  CALCIUM 7.5* 7.8* 8.0* 8.0* 8.3*  MG  --   --   --   --  2.0  PHOS 4.4 5.1* 3.9 3.7 3.3   Liver Function Tests:  Recent Labs Lab 10/23/15 0542 10/23/15 1245  10/25/15 0402 10/26/15 0509 10/27/15 0330 10/28/15 0531 10/29/15 0401  AST 44* 39  --  30 25  --  34  --   ALT 25 21  --  17 11*  --  13*  --   ALKPHOS 58 44  --  61 61  --  64  --   BILITOT 1.5* 1.4*  --  1.0 0.8  --  0.8  --   PROT 5.6* 5.1*  --  6.3* 5.9*  --  5.0*  --   ALBUMIN 2.2* 2.0*  < > 2.3* 1.9* 1.8* 1.7*  1.8*  < > = values in this interval not displayed.  Recent Labs Lab 10/23/15 0542 10/25/15 0402  LIPASE 149* 116*   CBC:  Recent Labs Lab 10/25/15 0402 10/26/15 0509 10/27/15 0330 10/28/15 0531 10/29/15 0401  WBC 9.0 10.4 13.0* 14.2* 17.1*  NEUTROABS  --   --   --   --  13.9*  HGB 11.2* 10.7* 9.6* 9.4* 9.3*  HCT 33.0* 30.1* 27.2* 26.9* 27.8*  MCV 105.4* 102.7* 103.4* 103.1* 105.3*  PLT 70* 143* 212 342 453*     Studies/Results: Dg Chest 2 View  10/28/2015  CLINICAL DATA:  Dyspnea. Short of breath on  exertion. No chest pain. History of hypertension. EXAM: CHEST  2 VIEW COMPARISON:  10/23/2015 FINDINGS: Opacity at the lung bases obscures a hemidiaphragms consistent with a combination of moderate bilateral effusions and atelectasis. No convincing pneumonia. No pulmonary edema. Cardiac silhouette is normal in size. No mediastinal or hilar masses or convincing adenopathy. Skeletal structures are unremarkable. IMPRESSION: Moderate bilateral pleural effusions with associated atelectasis. No convincing pneumonia. No pulmonary edema. Electronically Signed   By: Amie Portland M.D.   On: 10/28/2015 14:25    Medications:  Scheduled: . carvedilol  6.25 mg Oral BID WC  . cyanocobalamin  1,000 mcg Intramuscular Daily  . folic acid  1 mg Oral Daily  . imipenem-cilastatin  500 mg Intravenous Q8H  . magnesium sulfate 1 - 4 g bolus IVPB  2 g Intravenous Once  . multivitamin with minerals  1 tablet Oral Daily  . potassium chloride  40 mEq Oral TID  . sodium bicarbonate  1,300 mg Oral BID  . sodium chloride  3 mL Intravenous Q12H  . thiamine  100 mg Oral Daily   Anti-infectives    Start     Dose/Rate Route Frequency Ordered Stop   10/28/15 1200  imipenem-cilastatin (PRIMAXIN) 500 mg in sodium chloride 0.9 % 100 mL IVPB     500 mg 200 mL/hr over 30 Minutes Intravenous Every 8 hours 10/28/15 0844     10/27/15 1600  vancomycin (VANCOCIN) 1,500 mg in sodium chloride 0.9 % 500 mL IVPB   Status:  Discontinued     1,500 mg 250 mL/hr over 120 Minutes Intravenous Every 48 hours 10/25/15 1604 10/27/15 0925   10/27/15 1200  vancomycin (VANCOCIN) 1,250 mg in sodium chloride 0.9 % 250 mL IVPB  Status:  Discontinued     1,250 mg 166.7 mL/hr over 90 Minutes Intravenous Every 24 hours 10/27/15 0925 10/29/15 0749   10/25/15 1615  vancomycin (VANCOCIN) 2,000 mg in sodium chloride 0.9 % 500 mL IVPB     2,000 mg 250 mL/hr over 120 Minutes Intravenous  Once 10/25/15 1604 10/25/15 1829   10/23/15 1600  vancomycin (VANCOCIN) 1,250 mg in sodium chloride 0.9 % 250 mL IVPB  Status:  Discontinued     1,250 mg 166.7 mL/hr over 90 Minutes Intravenous Every 24 hours 10/22/15 1600 10/23/15 1130   10/23/15 1200  imipenem-cilastatin (PRIMAXIN) 250 mg in sodium chloride 0.9 % 100 mL IVPB  Status:  Discontinued     250 mg 200 mL/hr over 30 Minutes Intravenous 4 times per day 10/23/15 1102 10/28/15 0844   10/22/15 1615  vancomycin (VANCOCIN) 2,000 mg in sodium chloride 0.9 % 500 mL IVPB     2,000 mg 250 mL/hr over 120 Minutes Intravenous  Once 10/22/15 1600 10/22/15 1919   10/22/15 1615  imipenem-cilastatin (PRIMAXIN) 500 mg in sodium chloride 0.9 % 100 mL IVPB  Status:  Discontinued     500 mg 200 mL/hr over 30 Minutes Intravenous 3 times per day 10/22/15 1600 10/23/15 1102      Continuous:   ZOX:[id number], HYDROmorphone (DILAUDID) injection, LORazepam, ondansetron (ZOFRAN) IV, oxyCODONE  Assessment/Plan:  Principal Problem:   Acute alcoholic pancreatitis Active Problems:   ETOH abuse   Acute renal failure (HCC)   Alcohol withdrawal (HCC)   Thrombocytopenia (HCC)   Respiratory failure (HCC)   Pyrexia   Pancreatitis   Macrocytic anemia   HCAP (healthcare-associated pneumonia)    Acute alcoholic pancreatitis Patient improving steadily. Fever appears to have subsided. Fever was most likely secondary to pancreatic inflammation.  Repeat CT scan on 11/19 showed evidence for fluid  collection which could be early signs of pseudocyst formation. Clinically, patient continues to improve. Continue full liquid diet. WBC noted to be slightly more elevated today compared to yesterday. Continue imipenem and discontinue vancomycin as I do not see a clear indication for vancomycin at this time. Blood cultures negative thus far. Recheck lipase, suspect that this will be elevated if the patient has pseudocyst formation, repeat CT abdomen pelvis  extensive peripancreatic, retroperitoneal and left upper abdominal inflammatory/edematous change and fluid collections .will consult [facility] for further recommendations  .  Fever, low-grade Appears to be resolving. Most likely secondary to severe pancreatic inflammation. Could have also been secondary to pneumonia. Continue imipenem and stopped vancomycin. Cultures are negative so far.   Leukocytosis Repeat CT scan as  white blood cell count is increasing.  HCAP Noted on CT scan as discussed above. Patient has been on vancomycin and imipenem since 11/16. Discontinue vancomycin. Cultures are negative so far. Shortness of breath likely secondary to bilateral pleural effusion on CT.  may benefit from gentle diuresis.   Acute renal failure/hypokalemia/non anion gap metabolic acidosis Creatinine continues to improve. Renal failure was most likely secondary to contrast nephropathy. Nephrology is following. Patient was initially placed on IV fluids. He had poor urine output. He was subsequently given high-dose Lasix for about 24 hours. With this, patient started making large amounts of urine. Lasix was discontinued 11/19. Creatinine improving,  Renal ultrasound does not show any hydronephrosis. Replete potassium. Repeat Lasix 40 mg IV 1 today, in the setting of bilateral pleural effusions   Cardiomyopathy with Sinus tachycardia Echocardiogram shows EF of 40-45%. Patient does not have any known history of congestive heart failure or heart disease. EKG from a  few days ago does not show any ischemic changes. Patient denies any chest pain. We will repeat EKG.  Diffuse hypokinesis noted on echocardiogram. This could all be due to his acute illness. He may need outpatient cardiology evaluation. Patient was started on carvedilol. Sinus tachycardia was most likely related to acute illness/pain/alcohol withdrawal. TSH is normal. EKG showed sinus tachycardia. Heart rate appears to be improving.   Alcohol abuse with withdrawal symptoms Patient is stable. He has improved. He no longer has any symptoms of alcohol withdrawal. Thiamine, folic acid and multivitamins. 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   Thrombocytopenia This has resolved. Was likely due to alcohol use and acute illness. There could also have been an element of DIC. No overt bleeding.   Macrocytic anemia TSH is normal. Drop in hemoglobin, likely dilutional. Anemia panel reviewed. Elevated ferritin, most likely due to inflammation. Vitamin B-12 noted to be low. Initiated intramuscular vitamin B-12. Transition to oral vitamin B-12 in 1-2 days.  Knot/swelling in Right Upper Arm Appears to have resolved. No DVT on Doppler studies.   DVT Prophylaxis: SCD's Code Status: Full Code  Family Communication: discussed with the patient   Disposition Plan: Patient wants to go home, anticipate discharge tomorrow    LOS: 8 days   [facility]  [facility] Pager [phone] 10/29/2015, 9:09 AM  If 7PM-7AM, please contact night-coverage at [email or web address], password [email or web address]

## 2015-10-29 NOTE — Progress Notes (Signed)
OT Cancellation Note  Patient Details Name: Larry Maldonado MRN: [id number] DOB: [birth year]   Cancelled Treatment:    Reason Eval/Treat Not Completed: OT screened, no needs identified, will sign off. Spoke with pt and he has no OT concerns.  Earlie RavelingStraub, Britnie Colville L OTR/L [phone] 10/29/2015, 3:16 PM

## 2015-10-29 NOTE — Progress Notes (Signed)
PT Cancellation Note  Patient Details Name: Frutoso SchatzCalvin Reetz MRN: [id number] DOB: 07-03-1976   Cancelled Treatment:    Reason Eval/Treat Not Completed: PT screened, no needs identified, will sign off; patient reports he has been getting up on his own and feels close to baseline.  Nursing states has not visualized him getting up.  Patient demonstrated independence with mobility OOB and walking to door.  Discussed gradual return to activity once at home.  No further PT needs at this time.  Will sign off.  WYNN,CYNDI 10/29/2015, 2:22 PM  Sheran Lawlessyndi Wynn, [place] CarolinaPT [phone] 10/29/2015

## 2015-10-29 NOTE — Progress Notes (Signed)
Subjective: Interval History: has no complaint , wants to go home.  Objective: Vital signs in last 24 hours: Temp:  [99.3 F (37.4 C)-100 F (37.8 C)] 99.3 F (37.4 C) (11/23 0516) Pulse Rate:  [87-92] 87 (11/23 0516) Resp:  [15-18] 16 (11/23 0516) BP: (125-153)/(72-82) 128/75 mmHg (11/23 0516) SpO2:  [98 %-99 %] 98 % (11/23 0516) Weight change:   Intake/Output from previous day: 11/22 0701 - 11/23 0700 In: 683 [P.O.:480; I.V.:3; IV Piggyback:200] Out: 300 [Urine:300] Intake/Output this shift: Total I/O In: -  Out: 675 [Urine:675]  General appearance: alert, cooperative and no distress Resp: clear to auscultation bilaterally Cardio: S1, S2 normal and systolic murmur: holosystolic 2/6, blowing at apex GI: mild distension, pos bs, nontender Extremities: extremities normal, atraumatic, no cyanosis or edema  Lab Results:  Recent Labs  10/28/15 0531 10/29/15 0401  WBC 14.2* 17.1*  HGB 9.4* 9.3*  HCT 26.9* 27.8*  PLT 342 453*   BMET:  Recent Labs  10/28/15 0531 10/29/15 0401  NA 138 143  K 2.8* 3.0*  CL 109 112*  CO2 20* 22  GLUCOSE 108* 100*  BUN 32* 20  CREATININE 2.12* 1.78*  CALCIUM 8.0* 8.3*   No results for input(s): PTH in the last 72 hours. Iron Studies: No results for input(s): IRON, TIBC, TRANSFERRIN, FERRITIN in the last 72 hours.  Studies/Results: Ct Abdomen Pelvis Wo Contrast  10/29/2015  CLINICAL DATA:  Hx of pancreatitis, ? Pseudocyst; pt is very frustrated, and wants to go home. He doesn't feel like he needs to be hospitalized.pt denies abd. pain, nausea,vomiting,fever. Acute renal failure. EXAM: CT ABDOMEN AND PELVIS WITHOUT CONTRAST TECHNIQUE: Multidetector CT imaging of the abdomen and pelvis was performed following the standard protocol without IV contrast. COMPARISON:  10/25/2015 FINDINGS: Small pleural effusions left greater than right, slightly decreased since previous exam. Atelectasis/ consolidation with air bronchograms in the  visualized lung bases. Stable probable subcapsular cyst in hepatic segment 6 as before. No new hepatic lesion. Unremarkable spleen, adrenal glands, kidneys. Unenhanced CT was performed per clinician order. Lack of IV contrast limits sensitivity and specificity, especially for evaluation of abdominal/pelvic solid viscera. Little convincing overall change in retroperitoneal fluid around the pancreatic body and tail extending into the left anterior perirenal space. Little change in fluid collections gastrocolic ligament, posterolateral and inferior to the gastric body. Stable fluid collection in the gastrohepatic ligament. 7 mm low-attenuation focus in the mid pancreatic body as before. Stomach is incompletely distended. Good distal passage of oral contrast material. Small bowel and colon are non dilated. Normal appendix. Urinary bladder incompletely distended. Bilateral pelvic phleboliths. No ascites.  No free air.  No definite adenopathy localized. Advanced degenerative disc disease L5-S1. Large Schmorl's nodes and L4 as before. IMPRESSION: 1. Little change in extensive peripancreatic, retroperitoneal and left upper abdominal inflammatory/edematous change and fluid collections. 2. Slight improvement in bilateral pleural effusions. Adjacent consolidation/atelectasis persists. Electronically Signed   By: Corlis Leak  Hassell M.D.   On: 10/29/2015 10:30   Dg Chest 2 View  10/28/2015  CLINICAL DATA:  Dyspnea. Short of breath on exertion. No chest pain. History of hypertension. EXAM: CHEST  2 VIEW COMPARISON:  10/23/2015 FINDINGS: Opacity at the lung bases obscures a hemidiaphragms consistent with a combination of moderate bilateral effusions and atelectasis. No convincing pneumonia. No pulmonary edema. Cardiac silhouette is normal in size. No mediastinal or hilar masses or convincing adenopathy. Skeletal structures are unremarkable. IMPRESSION: Moderate bilateral pleural effusions with associated atelectasis. No convincing  pneumonia. No pulmonary edema.  Electronically Signed   By: Amie Portland M.D.   On: 10/28/2015 14:25    I have reviewed the patient's current medications.  Assessment/Plan: 1 AKI resolving improving GFR ,  Not sure where will plateau but will have some residual 2 Pancreatitis 3 eTOH abuse P will s/o at this time , can f/u with primary    LOS: 8 days   Adiva Boettner L 10/29/2015,10:52 AM

## 2015-10-29 NOTE — Consult Note (Signed)
Reason for Consult: pancreatitis with pseudocyst  Referring Physician: Dr. Reyne Dumas   HPI: Larry Maldonado is a 39 year old male with heavy alcohol use over the last 6 months admitted on 10/21/15 with pancreatitis, acute renal failure, HCAP, also found to have cardiomyopathy.  The patient states his abdominal pain has resolved since Saturday.  Lipase is down to 137.  WBC has increased to 17.1k.  Remains on primaxin.  He has been afebrile.  Tolerating full liquids.  The patient denies a history of pancreatitis.  Initial CT significant for acute pancreatitis.  Repeat CT scan shows little changes in retroperitoneal and left upper abdominal inflammatory/edematous changes and fluid collections.  We have therefore been asked to evaluate.   Renal function has improved, tolerating fulls liquids, ambulating.   Abdominal US-no gallstones.   Past Medical History  Diagnosis Date  . Hypertension   . Chronic lower back pain   . Acute alcoholic pancreatitis 44/31/5400  . Alcohol related seizure ([place]) 04/2015    [name] 10/21/2015    Past Surgical History  Procedure Laterality Date  . Achilles tendon repair Left 2004    History reviewed. No pertinent family history.  Social History:  reports that he has been smoking Cigars.  He has never used smokeless tobacco. He reports that he drinks about 21.0 oz of alcohol per week. He reports that he does not use illicit drugs.  He works at [employer].   Allergies: No Known Allergies  Medications:  Scheduled Meds: . carvedilol  6.25 mg Oral BID WC  . cyanocobalamin  1,000 mcg Intramuscular Daily  . folic acid  1 mg Oral Daily  . imipenem-cilastatin  500 mg Intravenous Q8H  . multivitamin with minerals  1 tablet Oral Daily  . potassium chloride  40 mEq Oral TID  . sodium chloride  3 mL Intravenous Q12H  . thiamine  100 mg Oral Daily   Continuous Infusions:  PRN Meds:.acetaminophen, HYDROmorphone (DILAUDID) injection, LORazepam,  ondansetron (ZOFRAN) IV, oxyCODONE   Results for orders placed or performed during the hospital encounter of 10/21/15 (from the past 48 hour(s))  CBC     Status: Abnormal   Collection Time: 10/28/15  5:31 AM  Result Value Ref Range   WBC 14.2 (H) 4.0 - 10.5 K/uL   RBC 2.61 (L) 4.22 - 5.81 MIL/uL   Hemoglobin 9.4 (L) 13.0 - 17.0 g/dL   HCT 26.9 (L) 39.0 - 52.0 %   MCV 103.1 (H) 78.0 - 100.0 fL   MCH 36.0 (H) 26.0 - 34.0 pg   MCHC 34.9 30.0 - 36.0 g/dL   RDW 15.3 11.5 - 15.5 %   Platelets 342 150 - 400 K/uL  Phosphorus     Status: None   Collection Time: 10/28/15  5:31 AM  Result Value Ref Range   Phosphorus 3.7 2.5 - 4.6 mg/dL  Comprehensive metabolic panel     Status: Abnormal   Collection Time: 10/28/15  5:31 AM  Result Value Ref Range   Sodium 138 135 - 145 mmol/L   Potassium 2.8 (L) 3.5 - 5.1 mmol/L   Chloride 109 101 - 111 mmol/L   CO2 20 (L) 22 - 32 mmol/L   Glucose, Bld 108 (H) 65 - 99 mg/dL   BUN 32 (H) 6 - 20 mg/dL   Creatinine, Ser 2.12 (H) 0.61 - 1.24 mg/dL   Calcium 8.0 (L) 8.9 - 10.3 mg/dL   Total Protein 5.0 (L) 6.5 - 8.1 g/dL   Albumin 1.7 (  L) 3.5 - 5.0 g/dL   AST 34 15 - 41 U/L   ALT 13 (L) 17 - 63 U/L   Alkaline Phosphatase 64 38 - 126 U/L   Total Bilirubin 0.8 0.3 - 1.2 mg/dL   GFR calc non Af Amer 38 (L) >60 mL/min   GFR calc Af Amer 44 (L) >60 mL/min    Comment: (NOTE) The eGFR has been calculated using the CKD EPI equation. This calculation has not been validated in all clinical situations. eGFR's persistently <60 mL/min signify possible Chronic Kidney Disease.    Anion gap 9 5 - 15  Renal function panel     Status: Abnormal   Collection Time: 10/29/15  4:01 AM  Result Value Ref Range   Sodium 143 135 - 145 mmol/L   Potassium 3.0 (L) 3.5 - 5.1 mmol/L   Chloride 112 (H) 101 - 111 mmol/L   CO2 22 22 - 32 mmol/L   Glucose, Bld 100 (H) 65 - 99 mg/dL   BUN 20 6 - 20 mg/dL   Creatinine, Ser 1.78 (H) 0.61 - 1.24 mg/dL   Calcium 8.3 (L) 8.9 - 10.3  mg/dL   Phosphorus 3.3 2.5 - 4.6 mg/dL   Albumin 1.8 (L) 3.5 - 5.0 g/dL   GFR calc non Af Amer 47 (L) >60 mL/min   GFR calc Af Amer 54 (L) >60 mL/min    Comment: (NOTE) The eGFR has been calculated using the CKD EPI equation. This calculation has not been validated in all clinical situations. eGFR's persistently <60 mL/min signify possible Chronic Kidney Disease.    Anion gap 9 5 - 15  CBC with Differential/Platelet     Status: Abnormal   Collection Time: 10/29/15  4:01 AM  Result Value Ref Range   WBC 17.1 (H) 4.0 - 10.5 K/uL   RBC 2.64 (L) 4.22 - 5.81 MIL/uL   Hemoglobin 9.3 (L) 13.0 - 17.0 g/dL   HCT 27.8 (L) 39.0 - 52.0 %   MCV 105.3 (H) 78.0 - 100.0 fL   MCH 35.2 (H) 26.0 - 34.0 pg   MCHC 33.5 30.0 - 36.0 g/dL   RDW 15.3 11.5 - 15.5 %   Platelets 453 (H) 150 - 400 K/uL   Neutrophils Relative % 81 %   Lymphocytes Relative 9 %   Monocytes Relative 9 %   Eosinophils Relative 1 %   Basophils Relative 0 %   Neutro Abs 13.9 (H) 1.7 - 7.7 K/uL   Lymphs Abs 1.5 0.7 - 4.0 K/uL   Monocytes Absolute 1.5 (H) 0.1 - 1.0 K/uL   Eosinophils Absolute 0.2 0.0 - 0.7 K/uL   Basophils Absolute 0.0 0.0 - 0.1 K/uL   RBC Morphology SCHISTOCYTES PRESENT (2-5/hpf)    WBC Morphology MILD LEFT SHIFT (1-5% METAS, OCC MYELO, OCC BANDS)   Magnesium     Status: None   Collection Time: 10/29/15  4:01 AM  Result Value Ref Range   Magnesium 2.0 1.7 - 2.4 mg/dL  Lipid panel     Status: Abnormal   Collection Time: 10/29/15 10:30 AM  Result Value Ref Range   Cholesterol 94 0 - 200 mg/dL   Triglycerides 140 <150 mg/dL   HDL 18 (L) >40 mg/dL   Total CHOL/HDL Ratio 5.2 RATIO   VLDL 28 0 - 40 mg/dL   LDL Cholesterol 48 0 - 99 mg/dL    Comment:        Total Cholesterol/HDL:CHD Risk Coronary Heart Disease Risk Table  Men   Women  1/2 Average Risk   3.4   3.3  Average Risk       5.0   4.4  2 X Average Risk   9.6   7.1  3 X Average Risk  23.4   11.0        Use the calculated  Patient Ratio above and the CHD Risk Table to determine the patient's CHD Risk.        ATP III CLASSIFICATION (LDL):  <100     mg/dL   Optimal  100-129  mg/dL   Near or Above                    Optimal  130-159  mg/dL   Borderline  160-189  mg/dL   High  >190     mg/dL   Very High   Lipase, blood     Status: Abnormal   Collection Time: 10/29/15 10:30 AM  Result Value Ref Range   Lipase 137 (H) 11 - 51 U/L    Ct Abdomen Pelvis Wo Contrast  10/29/2015  CLINICAL DATA:  Hx of pancreatitis, ? Pseudocyst; pt is very frustrated, and wants to go home. He doesn't feel like he needs to be hospitalized.pt denies abd. pain, nausea,vomiting,fever. Acute renal failure. EXAM: CT ABDOMEN AND PELVIS WITHOUT CONTRAST TECHNIQUE: Multidetector CT imaging of the abdomen and pelvis was performed following the standard protocol without IV contrast. COMPARISON:  10/25/2015 FINDINGS: Small pleural effusions left greater than right, slightly decreased since previous exam. Atelectasis/ consolidation with air bronchograms in the visualized lung bases. Stable probable subcapsular cyst in hepatic segment 6 as before. No new hepatic lesion. Unremarkable spleen, adrenal glands, kidneys. Unenhanced CT was performed per clinician order. Lack of IV contrast limits sensitivity and specificity, especially for evaluation of abdominal/pelvic solid viscera. Little convincing overall change in retroperitoneal fluid around the pancreatic body and tail extending into the left anterior perirenal space. Little change in fluid collections gastrocolic ligament, posterolateral and inferior to the gastric body. Stable fluid collection in the gastrohepatic ligament. 7 mm low-attenuation focus in the mid pancreatic body as before. Stomach is incompletely distended. Good distal passage of oral contrast material. Small bowel and colon are non dilated. Normal appendix. Urinary bladder incompletely distended. Bilateral pelvic phleboliths. No ascites.   No free air.  No definite adenopathy localized. Advanced degenerative disc disease L5-S1. Large Schmorl's nodes and L4 as before. IMPRESSION: 1. Little change in extensive peripancreatic, retroperitoneal and left upper abdominal inflammatory/edematous change and fluid collections. 2. Slight improvement in bilateral pleural effusions. Adjacent consolidation/atelectasis persists. Electronically Signed   By: Lucrezia Europe M.D.   On: 10/29/2015 10:30   Dg Chest 2 View  10/28/2015  CLINICAL DATA:  Dyspnea. Short of breath on exertion. No chest pain. History of hypertension. EXAM: CHEST  2 VIEW COMPARISON:  10/23/2015 FINDINGS: Opacity at the lung bases obscures a hemidiaphragms consistent with a combination of moderate bilateral effusions and atelectasis. No convincing pneumonia. No pulmonary edema. Cardiac silhouette is normal in size. No mediastinal or hilar masses or convincing adenopathy. Skeletal structures are unremarkable. IMPRESSION: Moderate bilateral pleural effusions with associated atelectasis. No convincing pneumonia. No pulmonary edema. Electronically Signed   By: Lajean Manes M.D.   On: 10/28/2015 14:25    Review of Systems  All other systems reviewed and are negative.  Blood pressure 119/71, pulse 81, temperature 98.3 F (36.8 C), temperature source Oral, resp. rate 17, height $RemoveBe'6\' 4"'OFsmIlVAb$  (1.93 m), weight 111.5  kg (245 lb 13 oz), SpO2 99 %. Physical Exam  Constitutional: He is oriented to person, place, and time. He appears well-nourished. No distress.  Cardiovascular: Normal rate, regular rhythm, normal heart sounds and intact distal pulses.  Exam reveals no gallop and no friction rub.   No murmur heard. Respiratory: Effort normal and breath sounds normal. No respiratory distress. He has no wheezes. He has no rales. He exhibits no tenderness.  GI: Soft. Bowel sounds are normal. He exhibits no distension and no mass. There is no tenderness. There is no rebound and no guarding.  Musculoskeletal:  Normal range of motion. He exhibits edema. He exhibits no tenderness.  Neurological: He is alert and oriented to person, place, and time.  Skin: Skin is warm and dry. He is not diaphoretic.  Psychiatric: He has a normal mood and affect. His behavior is normal. Judgment and thought content normal.    Assessment/Plan: Acute pancreatitis and fluid collections-would recommend consultation to IR to aspirate the fluid collection and send for cultures.  If positive, continuing with antibiotics.  If negative, stopping the antibiotics.  He is not having fevers, however, white count is increasing.  The patient is completely non tender on exam.  Would also recommend advancing his diet as tolerated.  Alcohol cessation.  No surgical indications.  Thank you for consult.     Erby Pian ANP-BC Pager [phone] 10/29/2015, 2:41 PM

## 2015-10-30 LAB — COMPREHENSIVE METABOLIC PANEL
ALT: 19 U/L (ref 17–63)
ANION GAP: 8 (ref 5–15)
AST: 40 U/L (ref 15–41)
Albumin: 1.7 g/dL — ABNORMAL LOW (ref 3.5–5.0)
Alkaline Phosphatase: 72 U/L (ref 38–126)
BUN: 15 mg/dL (ref 6–20)
CALCIUM: 8.3 mg/dL — AB (ref 8.9–10.3)
CHLORIDE: 115 mmol/L — AB (ref 101–111)
CO2: 20 mmol/L — AB (ref 22–32)
Creatinine, Ser: 1.62 mg/dL — ABNORMAL HIGH (ref 0.61–1.24)
GFR calc non Af Amer: 52 mL/min — ABNORMAL LOW (ref >60)
Glucose, Bld: 97 mg/dL (ref 65–99)
Potassium: 3.1 mmol/L — ABNORMAL LOW (ref 3.5–5.1)
SODIUM: 143 mmol/L (ref 135–145)
Total Bilirubin: 0.5 mg/dL (ref 0.3–1.2)
Total Protein: 5.4 g/dL — ABNORMAL LOW (ref 6.5–8.1)

## 2015-10-30 LAB — CBC
HCT: 27.2 % — ABNORMAL LOW (ref 39.0–52.0)
Hemoglobin: 9.2 g/dL — ABNORMAL LOW (ref 13.0–17.0)
MCH: 35.2 pg — AB (ref 26.0–34.0)
MCHC: 33.8 g/dL (ref 30.0–36.0)
MCV: 104.2 fL — ABNORMAL HIGH (ref 78.0–100.0)
PLATELETS: 501 10*3/uL — AB (ref 150–400)
RBC: 2.61 MIL/uL — ABNORMAL LOW (ref 4.22–5.81)
RDW: 15.4 % (ref 11.5–15.5)
WBC: 15.8 10*3/uL — AB (ref 4.0–10.5)

## 2015-10-30 MED ORDER — FOLIC ACID 1 MG PO TABS: 1.0000 mg | ORAL_TABLET | Freq: Every day | ORAL | Status: AC

## 2015-10-30 MED ORDER — VITAMIN B-12 1000 MCG PO TABS: 1000.0000 ug | ORAL_TABLET | Freq: Every day | ORAL | Status: AC

## 2015-10-30 MED ORDER — OXYCODONE HCL 5 MG PO TABS: 5.0000 mg | ORAL_TABLET | ORAL | Status: AC | PRN

## 2015-10-30 MED ORDER — PANCRELIPASE (LIP-PROT-AMYL) 36000-114000 UNITS PO CPEP: 36000.0000 [IU] | ORAL_CAPSULE | Freq: Three times a day (TID) | ORAL | Status: AC

## 2015-10-30 MED ORDER — METRONIDAZOLE 500 MG PO TABS: 500.0000 mg | ORAL_TABLET | Freq: Three times a day (TID) | ORAL | Status: DC

## 2015-10-30 MED ORDER — POTASSIUM CHLORIDE 20 MEQ/15ML (10%) PO SOLN
60.0000 meq | ORAL | Status: DC
  Administered 2015-10-30: 60 meq via ORAL
  Filled 2015-10-30: qty 45

## 2015-10-30 MED ORDER — CIPROFLOXACIN HCL 500 MG PO TABS: 500.0000 mg | ORAL_TABLET | Freq: Every day | ORAL | Status: DC

## 2015-10-30 MED ORDER — POTASSIUM CHLORIDE ER 10 MEQ PO TBCR: 40.0000 meq | EXTENDED_RELEASE_TABLET | Freq: Every day | ORAL | Status: AC

## 2015-10-30 MED ORDER — CARVEDILOL 6.25 MG PO TABS: 6.2500 mg | ORAL_TABLET | Freq: Two times a day (BID) | ORAL | Status: AC

## 2015-10-30 MED ORDER — THIAMINE HCL 100 MG PO TABS: 100.0000 mg | ORAL_TABLET | Freq: Every day | ORAL | Status: AC

## 2015-10-30 NOTE — Discharge Summary (Signed)
Physician Discharge Summary  Larry Maldonado MRN: [id number] DOB/AGE: 1976/05/21 39 y.o.  PCP: No PCP Per Patient   Admit date: 10/21/2015 Discharge date: 10/30/2015  Discharge Diagnoses:     Principal Problem:   Acute alcoholic pancreatitis Active Problems:   ETOH abuse   Acute renal failure (HCC)   Alcohol withdrawal (HCC)   Thrombocytopenia (HCC)   Respiratory failure (HCC)   Pyrexia   Pancreatitis   Macrocytic anemia   HCAP (healthcare-associated pneumonia)    Follow-up recommendations Follow-up with PCP in 3-5 days , including all  additional recommended appointments as below Follow-up CBC, CMP in 3-5 days Patient needs a repeat CT of the abdomen and pelvis for his extensive peripancreatic/retroperitoneal fluid collections, this repeat CT is to be organized by his PCP     Medication List    TAKE these medications        carvedilol 6.25 MG tablet  Commonly known as:  COREG  Take 1 tablet (6.25 mg total) by mouth 2 (two) times daily with a meal.     ciprofloxacin 500 MG tablet  Commonly known as:  CIPRO  Take 1 tablet (500 mg total) by mouth daily with breakfast.     folic acid 1 MG tablet  Commonly known as:  FOLVITE  Take 1 tablet (1 mg total) by mouth daily.     lipase/protease/amylase 36000 UNITS Cpep capsule  Commonly known as:  CREON  Take 1 capsule (36,000 Units total) by mouth 3 (three) times daily before meals.     metroNIDAZOLE 500 MG tablet  Commonly known as:  FLAGYL  Take 1 tablet (500 mg total) by mouth 3 (three) times daily.     oxyCODONE 5 MG immediate release tablet  Commonly known as:  Oxy IR/ROXICODONE  Take 1 tablet (5 mg total) by mouth every 4 (four) hours as needed for moderate pain.     potassium chloride 10 MEQ tablet  Commonly known as:  K-DUR  Take 4 tablets (40 mEq total) by mouth daily.     thiamine 100 MG tablet  Take 1 tablet (100 mg total) by mouth daily.     vitamin B-12 1000 MCG tablet  Commonly known as:   CYANOCOBALAMIN  Take 1 tablet (1,000 mcg total) by mouth daily.         Discharge Condition: Stable   Discharge Instructions       Discharge Instructions    Diet - low sodium heart healthy    Complete by:  As directed      Increase activity slowly    Complete by:  As directed            No Known Allergies    Disposition: Final discharge disposition not confirmed   Consults:  General surgery Nephrology   Significant Diagnostic Studies:  Ct Abdomen Pelvis Wo Contrast  10/29/2015  CLINICAL DATA:  Hx of pancreatitis, ? Pseudocyst; pt is very frustrated, and wants to go home. He doesn't feel like he needs to be hospitalized.pt denies abd. pain, nausea,vomiting,fever. Acute renal failure. EXAM: CT ABDOMEN AND PELVIS WITHOUT CONTRAST TECHNIQUE: Multidetector CT imaging of the abdomen and pelvis was performed following the standard protocol without IV contrast. COMPARISON:  10/25/2015 FINDINGS: Small pleural effusions left greater than right, slightly decreased since previous exam. Atelectasis/ consolidation with air bronchograms in the visualized lung bases. Stable probable subcapsular cyst in hepatic segment 6 as before. No new hepatic lesion. Unremarkable spleen, adrenal glands, kidneys. Unenhanced CT was performed per clinician order.  Lack of IV contrast limits sensitivity and specificity, especially for evaluation of abdominal/pelvic solid viscera. Little convincing overall change in retroperitoneal fluid around the pancreatic body and tail extending into the left anterior perirenal space. Little change in fluid collections gastrocolic ligament, posterolateral and inferior to the gastric body. Stable fluid collection in the gastrohepatic ligament. 7 mm low-attenuation focus in the mid pancreatic body as before. Stomach is incompletely distended. Good distal passage of oral contrast material. Small bowel and colon are non dilated. Normal appendix. Urinary bladder incompletely  distended. Bilateral pelvic phleboliths. No ascites.  No free air.  No definite adenopathy localized. Advanced degenerative disc disease L5-S1. Large Schmorl's nodes and L4 as before. IMPRESSION: 1. Little change in extensive peripancreatic, retroperitoneal and left upper abdominal inflammatory/edematous change and fluid collections. 2. Slight improvement in bilateral pleural effusions. Adjacent consolidation/atelectasis persists. Electronically Signed   By: Lucrezia Europe M.D.   On: 10/29/2015 10:30   Ct Abdomen Pelvis Wo Contrast  10/25/2015  CLINICAL DATA:  Fever. Follow-up of acute pancreatitis. Acute renal failure. EXAM: CT ABDOMEN AND PELVIS WITHOUT CONTRAST TECHNIQUE: Multidetector CT imaging of the abdomen and pelvis was performed following the standard protocol without IV contrast. COMPARISON:  10/21/2015 FINDINGS: Lower chest: New bibasilar airspace disease. Normal heart size. Small bilateral pleural effusions, increased on the left and new on the right. Hepatobiliary: Mild hepatic steatosis. Right hepatic lobe low-density lesion is likely a cyst. Probable vicarious excretion of contrast by the gallbladder. No biliary duct dilatation. Pancreas: Pancreatic and peripancreatic edema is slightly increased. Low-density focus within the pancreatic body is again identified at 8 mm (image 44, series 2). Edema and fluid extending into the anterior para renal space. No well-defined pseudocyst. There is fluid adjacent to the anterior aspect of the pancreatic neck which measures 2.6 cm on image 40 and is new. Spleen: Normal in size, without focal abnormality. Adrenals/Urinary Tract: Normal adrenal glands. Retained contrast within the kidneys, consistent with a history of renal insufficiency. No hydronephrosis. Normal urinary bladder. Stomach/Bowel: Normal stomach, without wall thickening. Normal colon, appendix, and terminal ileum. Normal small bowel caliber. Vascular/Lymphatic: Normal caliber of the aorta and branch  vessels. No abdominopelvic adenopathy. Reproductive: Normal prostate. Other: Trace left abdominal ascites is similar.  Anasarca is new. Musculoskeletal: Thoracolumbar spondylosis. IMPRESSION: 1. New bibasilar airspace disease, consistent with pneumonia. 2. New and increased bilateral pleural effusions. 3. Slight progression of pancreatitis. Possible developing fluid collection anterior to the pancreatic neck could represent early pseudocyst. 4. No acute complication identified. 5. Hepatic steatosis. Electronically Signed   By: Abigail Miyamoto M.D.   On: 10/25/2015 14:46   Dg Chest 2 View  10/28/2015  CLINICAL DATA:  Dyspnea. Short of breath on exertion. No chest pain. History of hypertension. EXAM: CHEST  2 VIEW COMPARISON:  10/23/2015 FINDINGS: Opacity at the lung bases obscures a hemidiaphragms consistent with a combination of moderate bilateral effusions and atelectasis. No convincing pneumonia. No pulmonary edema. Cardiac silhouette is normal in size. No mediastinal or hilar masses or convincing adenopathy. Skeletal structures are unremarkable. IMPRESSION: Moderate bilateral pleural effusions with associated atelectasis. No convincing pneumonia. No pulmonary edema. Electronically Signed   By: Lajean Manes M.D.   On: 10/28/2015 14:25   Ct Abdomen Pelvis W Contrast  10/21/2015  CLINICAL DATA:  Upper abdominal pain for 1 day. History of alcoholism. EXAM: CT ABDOMEN AND PELVIS WITH CONTRAST TECHNIQUE: Multidetector CT imaging of the abdomen and pelvis was performed using the standard protocol following bolus administration of intravenous contrast. CONTRAST:  50m OMNIPAQUE IOHEXOL 300 MG/ML  SOLN COMPARISON:  None. FINDINGS: Lower chest: No significant pulmonary nodules or acute consolidative airspace disease. Hepatobiliary: Diffuse hepatic steatosis. Simple 1.3 cm right liver lobe cyst. No additional liver lesions. No gross liver surface irregularity. Normal gallbladder with no radiopaque cholelithiasis. No  biliary ductal dilatation. Pancreas: There is diffuse thickening of the entire pancreas with prominent peripancreatic fat stranding and ill-defined fluid along the entire length of the pancreas, in keeping with acute pancreatitis. There is a nonspecific 1.0 x 0.7 cm cystic focus in the pancreatic body (series 2/image 35). No main pancreatic duct dilation. No additional focal pancreatic lesions. No pancreatic gas or areas of pancreatic parenchymal hypoenhancement. There is extension of inflammatory fluid into the left greater than right bilateral anterior para renal retroperitoneal space. There is extension of inflammatory fluid into the central mesentery and gastrocolic ligament, with a 7.2 x 2.5 x 3.9 cm left gastrocolic ligament fluid collection. Spleen: Normal size. No mass. Adrenals/Urinary Tract: Normal adrenals. Normal kidneys with no hydronephrosis and no renal mass. Normal bladder. Stomach/Bowel: Grossly normal stomach. Normal caliber small bowel with no small bowel wall thickening. Normal appendix. Normal large bowel with no diverticulosis, large bowel wall thickening or pericolonic fat stranding. Vascular/Lymphatic: Normal caliber abdominal aorta. Patent portal, splenic, hepatic and renal veins. No pathologically enlarged lymph nodes in the abdomen or pelvis. Reproductive: Normal size prostate. Other: No pneumoperitoneum. Small volume ascites, predominantly perihepatic and deep pelvic. Musculoskeletal: No aggressive appearing focal osseous lesions. Severe degenerative disc disease at L5-S1. Prominent Schmorl's nodes in the superior and inferior endplates of the L4 vertebral body. Mild degenerative changes throughout the remaining visualized thoracolumbar spine. Mild gynecomastia, slightly asymmetric to the left. Small fat containing umbilical hernia. Suggestion of a small fat containing left inguinal hernia. IMPRESSION: 1. Acute pancreatitis. No evidence of necrotizing pancreatitis. No vascular  complication. 2. Nonspecific 1.0 cm cystic focus in the pancreatic body, which could represent a developing pseudocyst. Recommend attention on follow-up MRI abdomen with and without intravenous contrast in 3 months. 3. Prominent inflammatory fluid extending into the bilateral retroperitoneum and gastrocolic ligament, with a developing left gastrocolic ligament fluid collection as described. 4. Small volume ascites. 5. Diffuse hepatic steatosis. Electronically Signed   By: JIlona SorrelM.D.   On: 10/21/2015 15:22   [place]  10/23/2015  CLINICAL DATA:  Renal failure and hypertension. EXAM: RENAL / URINARY TRACT ULTRASOUND COMPLETE COMPARISON:  Abdominal CT 10/21/2015 FINDINGS: Right Kidney: Length: 12.8 cm. Echogenicity within normal limits. No mass or hydronephrosis visualized. Left Kidney: Length: 14.8 cm. Echogenicity within normal limits. No mass or hydronephrosis visualized. A central cyst along the mid/lower pole. This cyst measures up to 2.2 cm. Bladder: Appears normal for degree of bladder distention. Other: The liver has increased echogenicity suggesting fat infiltration. Small amount of ascites along the inferior aspect of the liver. IMPRESSION: Normal appearance of both kidneys without hydronephrosis. Left renal cyst. Fatty liver with ascites. Electronically Signed   By: AMarkus DaftM.D.   On: 10/23/2015 12:11   Dg Chest Port 1 View  10/23/2015  CLINICAL DATA:  Respiratory failure.  Acute pancreatitis. EXAM: PORTABLE CHEST 1 VIEW COMPARISON:  10/22/2015 FINDINGS: The heart is enlarged but stable. Persistent low lung volumes with bibasilar atelectasis and probable small effusions. IMPRESSION: Persistent low lung volumes with bibasilar atelectasis and probable small effusions. Electronically Signed   By: PMarijo SanesM.D.   On: 10/23/2015 09:08   Dg Chest Port 1 View  10/22/2015  CLINICAL  DATA:  Hypoxemia EXAM: PORTABLE CHEST - 1 VIEW COMPARISON:  04/20/2015 FINDINGS: Poor inspiratory effort is  noted. The cardiac shadow is accentuated by poor inspiratory effort. Right basilar atelectatic changes are noted with blunting of the costophrenic angle. This may represent a small effusion. Crowding of the vascular markings is seen. IMPRESSION: Right basilar atelectasis with likely small effusion present. Electronically Signed   By: Inez Catalina M.D.   On: 10/22/2015 15:02   US Abdomen Limited Ruq  10/22/2015  CLINICAL DATA:  Acute pancreatitis EXAM: US ABDOMEN LIMITED - RIGHT UPPER QUADRANT COMPARISON:  11/15/ 16 CT scan FINDINGS: Gallbladder: No gallstones are noted within gallbladder. There is thickening of gallbladder wall up to 8 mm. No sonographic Murphy's sign. Common bile duct: Diameter: 4.5 mm in diameter within normal limits. Liver: Small perihepatic ascites. There is diffuse increased echogenicity of the liver suspicious for fatty infiltration. IMPRESSION: 1. No shadowing gallstones are noted within gallbladder. Thickening of gallbladder wall up to 8 mm. No sonographic Murphy's sign. Normal CBD. Small perihepatic ascites. Fatty infiltration of the liver. Electronically Signed   By: Lahoma Crocker M.D.   On: 10/22/2015 12:20     2-D echo LV EF: 40% -  45%  ------------------------------------------------------------------- History:  PMH: ETOH abuse. Acute respiratory failure. Alcohol withdrawal.  ------------------------------------------------------------------- Study Conclusions  - Left ventricle: The cavity size was normal. Wall thickness was increased in a pattern of mild LVH. Systolic function was mildly to moderately reduced. The estimated ejection fraction was in the range of 40% to 45%. Diffuse hypokinesis. Abnormal strain pattern, GLS -13.7%. Doppler parameters are consistent with abnormal left ventricular relaxation (grade 1 diastolic dysfunction).   Filed Weights   10/21/15 1134 10/21/15 1815 10/22/15 1425  Weight: 106.595 kg (235 lb) 106.595 kg (235 lb)  111.5 kg (245 lb 13 oz)     Microbiology: Recent Results (from the past 240 hour(s))  Culture, blood (routine x 2)     Status: None   Collection Time: 10/22/15  3:35 PM  Result Value Ref Range Status   Specimen Description BLOOD RIGHT ARM  Final   Special Requests IN PEDIATRIC BOTTLE  2CC  Final   Culture NO GROWTH 5 DAYS  Final   Report Status 10/27/2015 FINAL  Final  Culture, blood (routine x 2)     Status: None   Collection Time: 10/22/15  3:40 PM  Result Value Ref Range Status   Specimen Description BLOOD RIGHT HAND  Final   Special Requests BOTTLES DRAWN AEROBIC ONLY  4CC  Final   Culture NO GROWTH 5 DAYS  Final   Report Status 10/27/2015 FINAL  Final  Culture, Urine     Status: None   Collection Time: 10/22/15  5:25 PM  Result Value Ref Range Status   Specimen Description URINE, CLEAN CATCH  Final   Special Requests NONE  Final   Culture 1,000 COLONIES/mL INSIGNIFICANT GROWTH  Final   Report Status 10/23/2015 FINAL  Final  MRSA PCR Screening     Status: None   Collection Time: 10/22/15 10:21 PM  Result Value Ref Range Status   MRSA by PCR NEGATIVE NEGATIVE Final    Comment:        The GeneXpert MRSA Assay (FDA approved for NASAL specimens only), is one component of a comprehensive MRSA colonization surveillance program. It is not intended to diagnose MRSA infection nor to guide or monitor treatment for MRSA infections.        Blood Culture    Component  Value Date/Time   SDES URINE, CLEAN CATCH 10/22/2015 1725   SPECREQUEST NONE 10/22/2015 1725   CULT 1,000 COLONIES/mL INSIGNIFICANT GROWTH 10/22/2015 1725   REPTSTATUS 10/23/2015 FINAL 10/22/2015 1725      Labs: Results for orders placed or performed during the hospital encounter of 10/21/15 (from the past 48 hour(s))  Renal function panel     Status: Abnormal   Collection Time: 10/29/15  4:01 AM  Result Value Ref Range   Sodium 143 135 - 145 mmol/L   Potassium 3.0 (L) 3.5 - 5.1 mmol/L   Chloride  112 (H) 101 - 111 mmol/L   CO2 22 22 - 32 mmol/L   Glucose, Bld 100 (H) 65 - 99 mg/dL   BUN 20 6 - 20 mg/dL   Creatinine, Ser 1.78 (H) 0.61 - 1.24 mg/dL   Calcium 8.3 (L) 8.9 - 10.3 mg/dL   Phosphorus 3.3 2.5 - 4.6 mg/dL   Albumin 1.8 (L) 3.5 - 5.0 g/dL   GFR calc non Af Amer 47 (L) >60 mL/min   GFR calc Af Amer 54 (L) >60 mL/min    Comment: (NOTE) The eGFR has been calculated using the CKD EPI equation. This calculation has not been validated in all clinical situations. eGFR's persistently <60 mL/min signify possible Chronic Kidney Disease.    Anion gap 9 5 - 15  CBC with Differential/Platelet     Status: Abnormal   Collection Time: 10/29/15  4:01 AM  Result Value Ref Range   WBC 17.1 (H) 4.0 - 10.5 K/uL   RBC 2.64 (L) 4.22 - 5.81 MIL/uL   Hemoglobin 9.3 (L) 13.0 - 17.0 g/dL   HCT 27.8 (L) 39.0 - 52.0 %   MCV 105.3 (H) 78.0 - 100.0 fL   MCH 35.2 (H) 26.0 - 34.0 pg   MCHC 33.5 30.0 - 36.0 g/dL   RDW 15.3 11.5 - 15.5 %   Platelets 453 (H) 150 - 400 K/uL   Neutrophils Relative % 81 %   Lymphocytes Relative 9 %   Monocytes Relative 9 %   Eosinophils Relative 1 %   Basophils Relative 0 %   Neutro Abs 13.9 (H) 1.7 - 7.7 K/uL   Lymphs Abs 1.5 0.7 - 4.0 K/uL   Monocytes Absolute 1.5 (H) 0.1 - 1.0 K/uL   Eosinophils Absolute 0.2 0.0 - 0.7 K/uL   Basophils Absolute 0.0 0.0 - 0.1 K/uL   RBC Morphology SCHISTOCYTES PRESENT (2-5/hpf)    WBC Morphology MILD LEFT SHIFT (1-5% METAS, OCC MYELO, OCC BANDS)   Magnesium     Status: None   Collection Time: 10/29/15  4:01 AM  Result Value Ref Range   Magnesium 2.0 1.7 - 2.4 mg/dL  Lipid panel     Status: Abnormal   Collection Time: 10/29/15 10:30 AM  Result Value Ref Range   Cholesterol 94 0 - 200 mg/dL   Triglycerides 140 <150 mg/dL   HDL 18 (L) >40 mg/dL   Total CHOL/HDL Ratio 5.2 RATIO   VLDL 28 0 - 40 mg/dL   LDL Cholesterol 48 0 - 99 mg/dL    Comment:        Total Cholesterol/HDL:CHD Risk Coronary Heart Disease Risk Table                      Men   Women  1/2 Average Risk   3.4   3.3  Average Risk       5.0   4.4  2 X Average Risk  9.6   7.1  3 X Average Risk  23.4   11.0        Use the calculated Patient Ratio above and the CHD Risk Table to determine the patient's CHD Risk.        ATP III CLASSIFICATION (LDL):  <100     mg/dL   Optimal  100-129  mg/dL   Near or Above                    Optimal  130-159  mg/dL   Borderline  160-189  mg/dL   High  >190     mg/dL   Very High   Lipase, blood     Status: Abnormal   Collection Time: 10/29/15 10:30 AM  Result Value Ref Range   Lipase 137 (H) 11 - 51 U/L  CBC     Status: Abnormal   Collection Time: 10/30/15  2:52 AM  Result Value Ref Range   WBC 15.8 (H) 4.0 - 10.5 K/uL   RBC 2.61 (L) 4.22 - 5.81 MIL/uL   Hemoglobin 9.2 (L) 13.0 - 17.0 g/dL   HCT 27.2 (L) 39.0 - 52.0 %   MCV 104.2 (H) 78.0 - 100.0 fL   MCH 35.2 (H) 26.0 - 34.0 pg   MCHC 33.8 30.0 - 36.0 g/dL   RDW 15.4 11.5 - 15.5 %   Platelets 501 (H) 150 - 400 K/uL  Comprehensive metabolic panel     Status: Abnormal   Collection Time: 10/30/15  2:52 AM  Result Value Ref Range   Sodium 143 135 - 145 mmol/L   Potassium 3.1 (L) 3.5 - 5.1 mmol/L   Chloride 115 (H) 101 - 111 mmol/L   CO2 20 (L) 22 - 32 mmol/L   Glucose, Bld 97 65 - 99 mg/dL   BUN 15 6 - 20 mg/dL   Creatinine, Ser 1.62 (H) 0.61 - 1.24 mg/dL   Calcium 8.3 (L) 8.9 - 10.3 mg/dL   Total Protein 5.4 (L) 6.5 - 8.1 g/dL   Albumin 1.7 (L) 3.5 - 5.0 g/dL   AST 40 15 - 41 U/L   ALT 19 17 - 63 U/L   Alkaline Phosphatase 72 38 - 126 U/L   Total Bilirubin 0.5 0.3 - 1.2 mg/dL   GFR calc non Af Amer 52 (L) >60 mL/min   GFR calc Af Amer >60 >60 mL/min    Comment: (NOTE) The eGFR has been calculated using the CKD EPI equation. This calculation has not been validated in all clinical situations. eGFR's persistently <60 mL/min signify possible Chronic Kidney Disease.    Anion gap 8 5 - 15     Lipid Panel     Component Value Date/Time    CHOL 94 10/29/2015 1030   TRIG 140 10/29/2015 1030   HDL 18* 10/29/2015 1030   CHOLHDL 5.2 10/29/2015 1030   VLDL 28 10/29/2015 1030   LDLCALC 48 10/29/2015 1030     No results found for: HGBA1C   Lab Results  Component Value Date   LDLCALC 48 10/29/2015   CREATININE 1.62* 10/30/2015     HPI :*39yo BM admitted 10/21/15 For abd pain and pancreatitis. Denies any hx of CKD and says his BP is variable but has never been treated with antiHTN but also has not seen a doctor in a long while. No baseline Scr but Scr on admission was 1.6. He received CT with contrast on 11/15. Since admission Scr has progressively increased to 4.2. No hypotension.  Says he was eating and drinking fine until few hours PTA. UO 450cc and 500cc/d. Says he has urinated 4 x today. + 6.2L in fluids. No hx gross hematuria. Denies use of NSAID's or herbal medicine. Renal US unremarkable   HOSPITAL COURSE:  Acute alcoholic pancreatitis Patient improving steadily. Fever appears to have subsided. Fever was most likely secondary to pancreatic inflammation. Repeat CT scan on 11/19 showed evidence for fluid collection which could be early signs of pseudocyst formation. Clinically, patient continues to improve. Given increase in white count from 10,000-17,000, patient had a repeat CAT scan on 11/23  which showed  stable but extensive peripancreatic retroperitoneal and left upper abdominal inflammatory edematous changes and fluid collections. General surgery was consulted and they recommended FNA to rule out any pancreatic abscess I discussed findings with Dr. Shanda Bumps in general surgery Dr. Reece Agar Patient has minimal abdominal pain does not appear toxic Tolerating low-fat diet Has  been on imipenem and vancomycin here since admission  At this time patient does not need fine-needle aspiration. Patient has been instructed to monitor for worsening abdominal pain, fever, nausea and return to the ER if anything changes He  has been started on pancreatic enzyme supplements and will continue with antibiotics for another 10 days He will need a repeat CT scan in 2 weeks to be arranged for by his primary care provider. At that point if the patient does develop a pseudocyst, it may need to be aspirated Patient to have repeat CBC, renal function panel next week Strongly recommended to stay away from alcohol  Fever, low-grade Appears to be resolving. Most likely secondary to severe pancreatic inflammation. Could have also been secondary to pneumonia as well.  Has received 9 days of imipenem and 8 days of vancomycin Switched to ciprofloxacin and Flagyl   Leukocytosis Improved from 17-15.7 today prior to discharge, repeat in 3-5 days   HCAP Noted on CT scan as discussed above. Patient has been on vancomycin and imipenem since 11/16.  Shortness of breath likely secondary to bilateral pleural effusion on CT.  received a dose of Lasix yesterday Completed antibiotics for HCAP   Acute renal failure/hypokalemia/non anion gap metabolic acidosis Creatinine continues to improve. Renal failure was most likely secondary to contrast nephropathy. Nephrology is consulted.. Patient was initially placed on IV fluids. He had poor urine output. He was subsequently given high-dose Lasix for about 24 hours. With this, patient started making large amounts of urine. Lasix was discontinued 11/19. Creatinine improving, Renal ultrasound does not show any hydronephrosis.   creatinine upon admission 1.67, peak 4.79, 1.6 to prior to discharge   Cardiomyopathy with Sinus tachycardia Echocardiogram shows EF of 40-45%. Patient does not have any known history of congestive heart failure or heart disease. EKG from a few days ago does not show any ischemic changes. Patient denies any chest pain. We will repeat EKG. Diffuse hypokinesis noted on echocardiogram. This could all be due to his acute illness. He may need outpatient cardiology evaluation.  Patient was started on carvedilol. Sinus tachycardia was most likely related to acute illness/pain/alcohol withdrawal. TSH is normal. EKG showed sinus tachycardia. Heart rate appears to be improving.   Alcohol abuse with withdrawal symptoms Patient is stable. He has improved. He no longer has any symptoms of alcohol withdrawal. Thiamine, folic acid and multivitamins. 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   Thrombocytopenia This has resolved. Was likely due to alcohol use and acute illness. There could also have been an  element of DIC. No overt bleeding.   Macrocytic anemia TSH is normal. Drop in hemoglobin, likely dilutional. Anemia panel reviewed. Elevated ferritin, most likely due to inflammation. Vitamin B-12 noted to be low. Initiated intramuscular vitamin B-12. Transition to oral vitamin B-12    Knot/swelling in Right Upper Arm Appears to have resolved. No DVT on Doppler studies.     Discharge Exam:    Blood pressure 128/77, pulse 78, temperature 99.1 F (37.3 C), temperature source Oral, resp. rate 18, height 6' 4"  (1.93 m), weight 111.5 kg (245 lb 13 oz), SpO2 96 %. General appearance: alert, cooperative, appears stated age, no distress and tremulous Resp: Improved air entry bilateral bases. Some dullness to percussion is present. No crackles or wheezing. Cardio: S1, S2 is tachycardic, regular. Heart rate is much improved. No S3, S4. No rubs, murmurs, or bruit. No significant edema. GI: Abdomen remains benign. Soft. Less distended. No tenderness appreciated. No masses or organomegaly. Bowel sounds are present.  Neurologic: alert and oriented 3. Less Tremulous. No focal deficits. Right arm was examined. No swelling noted.     Follow-up Information    Follow up with [facility] On 11/13/2015.   Specialty:  Internal Medicine   Why:  hospital followup visit and establishment of PCP arranged for 11/13/15 at 8:30 am with Sharon Seller  NP   Contact information:   [address] [place]      Signed: Reyne Dumas 10/30/2015, 11:23 AM        Time spent >45 mins

## 2015-10-30 NOTE — Progress Notes (Signed)
Subjective: No complaints  Objective: Vital signs in last 24 hours: Temp:  [98.3 F (36.8 C)-99.1 F (37.3 C)] 99.1 F (37.3 C) (11/24 0624) Pulse Rate:  [78-84] 78 (11/24 0624) Resp:  [17-22] 18 (11/24 0624) BP: (119-132)/(65-77) 128/77 mmHg (11/24 0624) SpO2:  [96 %-99 %] 96 % (11/24 0624) Last BM Date: 10/30/15  Intake/Output from previous day: 11/23 0701 - 11/24 0700 In: 700 [P.O.:600; IV Piggyback:100] Out: 2100 [Urine:2100] Intake/Output this shift:    Resp: clear to auscultation bilaterally Cardio: regular rate and rhythm GI: soft, non-tender; bowel sounds normal; no masses,  no organomegaly  Lab Results:   Recent Labs  10/29/15 0401 10/30/15 0252  WBC 17.1* 15.8*  HGB 9.3* 9.2*  HCT 27.8* 27.2*  PLT 453* 501*   BMET  Recent Labs  10/29/15 0401 10/30/15 0252  NA 143 143  K 3.0* 3.1*  CL 112* 115*  CO2 22 20*  GLUCOSE 100* 97  BUN 20 15  CREATININE 1.78* 1.62*  CALCIUM 8.3* 8.3*   PT/INR No results for input(s): LABPROT, INR in the last 72 hours. ABG No results for input(s): PHART, HCO3 in the last 72 hours.  Invalid input(s): PCO2, PO2  Studies/Results: Ct Abdomen Pelvis Wo Contrast  10/29/2015  CLINICAL DATA:  Hx of pancreatitis, ? Pseudocyst; pt is very frustrated, and wants to go home. He doesn't feel like he needs to be hospitalized.pt denies abd. pain, nausea,vomiting,fever. Acute renal failure. EXAM: CT ABDOMEN AND PELVIS WITHOUT CONTRAST TECHNIQUE: Multidetector CT imaging of the abdomen and pelvis was performed following the standard protocol without IV contrast. COMPARISON:  10/25/2015 FINDINGS: Small pleural effusions left greater than right, slightly decreased since previous exam. Atelectasis/ consolidation with air bronchograms in the visualized lung bases. Stable probable subcapsular cyst in hepatic segment 6 as before. No new hepatic lesion. Unremarkable spleen, adrenal glands, kidneys. Unenhanced CT was performed per clinician  order. Lack of IV contrast limits sensitivity and specificity, especially for evaluation of abdominal/pelvic solid viscera. Little convincing overall change in retroperitoneal fluid around the pancreatic body and tail extending into the left anterior perirenal space. Little change in fluid collections gastrocolic ligament, posterolateral and inferior to the gastric body. Stable fluid collection in the gastrohepatic ligament. 7 mm low-attenuation focus in the mid pancreatic body as before. Stomach is incompletely distended. Good distal passage of oral contrast material. Small bowel and colon are non dilated. Normal appendix. Urinary bladder incompletely distended. Bilateral pelvic phleboliths. No ascites.  No free air.  No definite adenopathy localized. Advanced degenerative disc disease L5-S1. Large Schmorl's nodes and L4 as before. IMPRESSION: 1. Little change in extensive peripancreatic, retroperitoneal and left upper abdominal inflammatory/edematous change and fluid collections. 2. Slight improvement in bilateral pleural effusions. Adjacent consolidation/atelectasis persists. Electronically Signed   By: Corlis Leak M.D.   On: 10/29/2015 10:30   Dg Chest 2 View  10/28/2015  CLINICAL DATA:  Dyspnea. Short of breath on exertion. No chest pain. History of hypertension. EXAM: CHEST  2 VIEW COMPARISON:  10/23/2015 FINDINGS: Opacity at the lung bases obscures a hemidiaphragms consistent with a combination of moderate bilateral effusions and atelectasis. No convincing pneumonia. No pulmonary edema. Cardiac silhouette is normal in size. No mediastinal or hilar masses or convincing adenopathy. Skeletal structures are unremarkable. IMPRESSION: Moderate bilateral pleural effusions with associated atelectasis. No convincing pneumonia. No pulmonary edema. Electronically Signed   By: Amie Portland M.D.   On: 10/28/2015 14:25    Anti-infectives: Anti-infectives    Start  Dose/Rate Route Frequency Ordered Stop    10/28/15 1200  imipenem-cilastatin (PRIMAXIN) 500 mg in sodium chloride 0.9 % 100 mL IVPB     500 mg 200 mL/hr over 30 Minutes Intravenous Every 8 hours 10/28/15 0844     10/27/15 1600  vancomycin (VANCOCIN) 1,500 mg in sodium chloride 0.9 % 500 mL IVPB  Status:  Discontinued     1,500 mg 250 mL/hr over 120 Minutes Intravenous Every 48 hours 10/25/15 1604 10/27/15 0925   10/27/15 1200  vancomycin (VANCOCIN) 1,250 mg in sodium chloride 0.9 % 250 mL IVPB  Status:  Discontinued     1,250 mg 166.7 mL/hr over 90 Minutes Intravenous Every 24 hours 10/27/15 0925 10/29/15 0749   10/25/15 1615  vancomycin (VANCOCIN) 2,000 mg in sodium chloride 0.9 % 500 mL IVPB     2,000 mg 250 mL/hr over 120 Minutes Intravenous  Once 10/25/15 1604 10/25/15 1829   10/23/15 1600  vancomycin (VANCOCIN) 1,250 mg in sodium chloride 0.9 % 250 mL IVPB  Status:  Discontinued     1,250 mg 166.7 mL/hr over 90 Minutes Intravenous Every 24 hours 10/22/15 1600 10/23/15 1130   10/23/15 1200  imipenem-cilastatin (PRIMAXIN) 250 mg in sodium chloride 0.9 % 100 mL IVPB  Status:  Discontinued     250 mg 200 mL/hr over 30 Minutes Intravenous 4 times per day 10/23/15 1102 10/28/15 0844   10/22/15 1615  vancomycin (VANCOCIN) 2,000 mg in sodium chloride 0.9 % 500 mL IVPB     2,000 mg 250 mL/hr over 120 Minutes Intravenous  Once 10/22/15 1600 10/22/15 1919   10/22/15 1615  imipenem-cilastatin (PRIMAXIN) 500 mg in sodium chloride 0.9 % 100 mL IVPB  Status:  Discontinued     500 mg 200 mL/hr over 30 Minutes Intravenous 3 times per day 10/22/15 1600 10/23/15 1102      Assessment/Plan: s/p * No surgery found * Advance diet to low fat as tolerated Avoid all alcohol No surgical issues at this point. Will sign off  LOS: 9 days    TOTH III,PAUL S 10/30/2015

## 2015-11-13 ENCOUNTER — Ambulatory Visit (INDEPENDENT_AMBULATORY_CARE_PROVIDER_SITE_OTHER): Payer: Self-pay | Admitting: Family Medicine

## 2015-11-13 ENCOUNTER — Encounter: Payer: Self-pay | Admitting: Family Medicine

## 2015-11-13 VITALS — BP 145/89 | HR 82 | Temp 98.2°F | Ht 76.0 in | Wt 228.0 lb

## 2015-11-13 DIAGNOSIS — Z7689 Persons encountering health services in other specified circumstances: Secondary | ICD-10-CM

## 2015-11-13 DIAGNOSIS — Z7189 Other specified counseling: Secondary | ICD-10-CM

## 2015-11-13 DIAGNOSIS — K859 Acute pancreatitis without necrosis or infection, unspecified: Secondary | ICD-10-CM

## 2015-11-13 LAB — CBC WITH DIFFERENTIAL/PLATELET
BASOS ABS: 0 10*3/uL (ref 0.0–0.1)
Basophils Relative: 1 % (ref 0–1)
Eosinophils Absolute: 0.3 10*3/uL (ref 0.0–0.7)
Eosinophils Relative: 7 % — ABNORMAL HIGH (ref 0–5)
HEMATOCRIT: 33.3 % — AB (ref 39.0–52.0)
HEMOGLOBIN: 10.8 g/dL — AB (ref 13.0–17.0)
LYMPHS ABS: 1.9 10*3/uL (ref 0.7–4.0)
LYMPHS PCT: 43 % (ref 12–46)
MCH: 33.9 pg (ref 26.0–34.0)
MCHC: 32.4 g/dL (ref 30.0–36.0)
MCV: 104.4 fL — ABNORMAL HIGH (ref 78.0–100.0)
MPV: 9 fL (ref 8.6–12.4)
Monocytes Absolute: 0.5 10*3/uL (ref 0.1–1.0)
Monocytes Relative: 11 % (ref 3–12)
NEUTROS PCT: 38 % — AB (ref 43–77)
Neutro Abs: 1.7 10*3/uL (ref 1.7–7.7)
Platelets: 409 10*3/uL — ABNORMAL HIGH (ref 150–400)
RBC: 3.19 MIL/uL — ABNORMAL LOW (ref 4.22–5.81)
RDW: 15.5 % (ref 11.5–15.5)
WBC: 4.4 10*3/uL (ref 4.0–10.5)

## 2015-11-13 LAB — COMPLETE METABOLIC PANEL WITH GFR
ALT: 7 U/L — AB (ref 9–46)
AST: 17 U/L (ref 10–40)
Albumin: 2.8 g/dL — ABNORMAL LOW (ref 3.6–5.1)
Alkaline Phosphatase: 43 U/L (ref 40–115)
BUN: 9 mg/dL (ref 7–25)
CHLORIDE: 104 mmol/L (ref 98–110)
CO2: 29 mmol/L (ref 20–31)
CREATININE: 1.09 mg/dL (ref 0.60–1.35)
Calcium: 8.7 mg/dL (ref 8.6–10.3)
GFR, EST NON AFRICAN AMERICAN: 86 mL/min (ref >=60)
Glucose, Bld: 79 mg/dL (ref 65–99)
Potassium: 3.8 mmol/L (ref 3.5–5.3)
SODIUM: 142 mmol/L (ref 135–146)
Total Bilirubin: 0.3 mg/dL (ref 0.2–1.2)
Total Protein: 6.1 g/dL (ref 6.1–8.1)

## 2015-11-13 LAB — LIPID PANEL
Cholesterol: 158 mg/dL (ref 125–200)
HDL: 46 mg/dL (ref >=40)
LDL CALC: 80 mg/dL (ref <130)
Total CHOL/HDL Ratio: 3.4 Ratio (ref <=5.0)
Triglycerides: 158 mg/dL — ABNORMAL HIGH (ref <150)
VLDL: 32 mg/dL — ABNORMAL HIGH (ref <30)

## 2015-11-13 LAB — LIPASE: LIPASE: 113 U/L — AB (ref 7–60)

## 2015-11-13 NOTE — Progress Notes (Signed)
Patient ID: Larry Maldonado, male   DOB: 12/02/1976, 39 y.o.   MRN: [id number]   Larry Maldonado, is a 39 y.o. male  [id number]  [id number]  DOB - [birth year]  CC:  Chief Complaint  Patient presents with  . Hospitalization Follow-up       HPI: Larry Maldonado is a 39 y.o. male here to establish care. He has not had regular health care in about 2 years. He was recently in the hospital from 11-17 - 11-24 for acute pancreatitis due to a drinking binge. She was also diagnosed with acute renal failure, thrombocytopenia, alcohol withdrawal, Respiratory Failure, pyrexia, macrocytic anemia and Health Care-associated pneumonia. He was started on folic acid, Creon, potassium, thiamin, Vitamin B and oxycodone. These medications and dosages are in medication list.  He was referred here for follow-up, repeat blood work and repeat CT scan. Referrals to general surgeon and nepthrologist were recommended but he has not type of insurance coverage or financial assistance. He reports drinking 2-3 drinks a day usually but went on a binge recently. His past medical history is positive for hypertension, chronic low back pain, alcohol related seizure.He reports not having anything to drink since his discharge on 11-24 and does not feel stopping will be a problem. No Known Allergies Past Medical History  Diagnosis Date  . Hypertension   . Chronic lower back pain   . Acute alcoholic pancreatitis 10/21/2015  . 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 carvedilol (COREG) 6.25 MG tablet Take 1 tablet (6.25 mg total) by mouth 2 (two) times daily with a meal. 60 tablet 1  . folic acid (FOLVITE) 1 MG tablet Take 1 tablet (1 mg total) by mouth daily. 30 tablet 1  . lipase/protease/amylase (CREON) 36000 UNITS CPEP capsule Take 1 capsule (36,000 Units total) by mouth 3 (three) times daily before meals. 90 capsule 0   . potassium chloride (K-DUR) 10 MEQ tablet Take 4 tablets (40 mEq total) by mouth daily. 120 tablet 0  . thiamine 100 MG tablet Take 1 tablet (100 mg total) by mouth daily. 30 tablet 1  . vitamin B-12 (CYANOCOBALAMIN) 1000 MCG tablet Take 1 tablet (1,000 mcg total) by mouth daily. 60 tablet 0  . oxyCODONE (OXY IR/ROXICODONE) 5 MG immediate release tablet Take 1 tablet (5 mg total) by mouth every 4 (four) hours as needed for moderate pain. (Patient not taking: Reported on 11/13/2015) 30 tablet 0   No current facility-administered medications on file prior to visit.   Family History  Problem Relation Age of Onset  . Hypertension Father    Social History   Social History  . Marital Status: Divorced    Spouse Name: N/A  . Number of Children: N/A  . Years of Education: N/A   Occupational History  . Not on file.   Social History Main Topics  . Smoking status: Current Some Day Smoker -- 15 years    Types: Cigars  . Smokeless tobacco: Never Used     Comment: 10/21/2015 "smoke only clove cigars; might smoke them for a month then nothing for 8 months or so"  . Alcohol Use: 21.0 oz/week    14 Cans of beer, 21 Shots of liquor per week     Comment: 10/21/2015 "probably 2 beers & 3 shots/day"  . Drug Use: No  . Sexual Activity: Not Currently   Other Topics Concern  . Not  on file   Social History Narrative    Review of Systems: Constitutional: Negative for fever, chills, appetite change, weight loss,  Fatigue. Skin: Negative for rashes or lesions of concern. HENT: Negative for ear pain, ear discharge.nose bleeds Eyes: Negative for pain, discharge, redness, itching and visual disturbance. Neck: Negative for pain, stiffness Respiratory: Negative for cough, shortness of breath,   Cardiovascular: Negative for chest pain, palpitations and leg swelling. Gastrointestinal: Negative for abdominal pain, nausea, vomiting, diarrhea, constipations Genitourinary: Negative for dysuria, urgency,  frequency, hematuria,  Musculoskeletal: Negative for back pain, joint pain, joint  swelling, and gait problem.Negative for weakness. Neurological: Negative for dizziness, tremors, seizures, syncope,   light-headedness, numbness and headaches.  Hematological: Negative for easy bruising or bleeding Psychiatric/Behavioral: Negative for depression, anxiety, decreased concentration, confusion   Objective:   Filed Vitals:   11/13/15 0814  BP: 145/89  Pulse: 82  Temp: 98.2 F (36.8 C)    Physical Exam: Constitutional: Patient appears well-developed and well-nourished. No distress. HENT: Normocephalic, atraumatic, External right and left ear normal. Oropharynx is clear and moist.  Eyes: Conjunctivae and EOM are normal. PERRLA, no scleral icterus. Neck: Normal ROM. Neck supple. No lymphadenopathy, No thyromegaly. CVS: RRR, S1/S2 +, no murmurs, no gallops, no rubs Pulmonary: Effort and breath sounds normal, no stridor, rhonchi, wheezes, rales.  Abdominal: Soft. Normoactive BS,, no distension, tenderness, rebound or guarding.  Musculoskeletal: Normal range of motion. No edema and no tenderness.  Neuro: Alert.Normal muscle tone coordination. Non-focal Skin: Skin is warm and dry. No rash noted. Not diaphoretic. No erythema. No pallor. Psychiatric: Normal mood and affect. Behavior, judgment, thought content normal.  Lab Results  Component Value Date   WBC 15.8* 10/30/2015   HGB 9.2* 10/30/2015   HCT 27.2* 10/30/2015   MCV 104.2* 10/30/2015   PLT 501* 10/30/2015   Lab Results  Component Value Date   CREATININE 1.62* 10/30/2015   BUN 15 10/30/2015   NA 143 10/30/2015   K 3.1* 10/30/2015   CL 115* 10/30/2015   CO2 20* 10/30/2015    No results found for: HGBA1C Lipid Panel     Component Value Date/Time   CHOL 94 10/29/2015 1030   TRIG 140 10/29/2015 1030   HDL 18* 10/29/2015 1030   CHOLHDL 5.2 10/29/2015 1030   VLDL 28 10/29/2015 1030   LDLCALC 48 10/29/2015 1030        Assessment and plan:   1. Acute pancreatitis, unspecified pancreatitis type  - COMPLETE METABOLIC PANEL WITH GFR - CBC with Differential - Vitamin D 1,25 dihydroxy - Lipid panel - Lipase - CT Abdomen Pelvis W Contrast; Future  2. Encounter to establish care I have reviewed information provided by the patient and information available in his [facility] chart.   No Follow-up on file.  The patient was given clear instructions to go to ER or return to medical center if symptoms don't improve, worsen or new problems develop. The patient verbalized understanding.    Henrietta HooverLinda C Bernhardt FNP  11/13/2015, 8:48 AM

## 2015-11-13 NOTE — Patient Instructions (Addendum)
If you have no insurance coverage and need financial assistance. [facility] Since you work and have an income, I do not know if you will qualify, but it is definitely worth a try. Continue medications for a least a few months. Will send refills Is very important that you not drink alcohol. Am refilling Carvedilol, Folic Acid, lipase, Potassium Thiamine

## 2015-11-17 ENCOUNTER — Other Ambulatory Visit: Payer: Self-pay | Admitting: Family Medicine

## 2015-11-17 ENCOUNTER — Ambulatory Visit (HOSPITAL_COMMUNITY)
Admission: RE | Admit: 2015-11-17 | Discharge: 2015-11-17 | Disposition: A | Discharge status: Home / Self Care | Payer: Self-pay | Source: Ambulatory Visit | Attending: Family Medicine | Admitting: Family Medicine

## 2015-11-17 ENCOUNTER — Encounter (HOSPITAL_COMMUNITY): Payer: Self-pay

## 2015-11-17 DIAGNOSIS — J90 Pleural effusion, not elsewhere classified: Secondary | ICD-10-CM | POA: Insufficient documentation

## 2015-11-17 DIAGNOSIS — K859 Acute pancreatitis without necrosis or infection, unspecified: Secondary | ICD-10-CM | POA: Insufficient documentation

## 2015-11-17 DIAGNOSIS — Z09 Encounter for follow-up examination after completed treatment for conditions other than malignant neoplasm: Secondary | ICD-10-CM | POA: Insufficient documentation

## 2015-11-17 DIAGNOSIS — J9811 Atelectasis: Secondary | ICD-10-CM | POA: Insufficient documentation

## 2015-11-17 LAB — VITAMIN D 1,25 DIHYDROXY
VITAMIN D 1, 25 (OH) TOTAL: 14 pg/mL — AB (ref 18–72)
VITAMIN D3 1, 25 (OH): 14 pg/mL

## 2015-11-17 MED ORDER — IOHEXOL 300 MG/ML  SOLN: 100.0000 mL | Freq: Once | INTRAMUSCULAR | Status: DC | PRN

## 2015-11-20 ENCOUNTER — Telehealth: Payer: Self-pay

## 2015-11-20 NOTE — Telephone Encounter (Signed)
Patient informed of lab results and CT results and recommendations.

## 2015-12-18 ENCOUNTER — Ambulatory Visit: Payer: Self-pay | Admitting: Family Medicine

## 2017-10-25 IMAGING — CT CT ABD-PELV W/O CM
2 of 4 series · 17 of 46 positions shown, 19 images · non-contrast
Comparison: CT abdomen pelvis 10/29/2015

CLINICAL DATA: Acute pancreatitis followup

EXAM:
CT ABDOMEN AND PELVIS WITHOUT CONTRAST
TECHNIQUE: Multidetector CT imaging of the abdomen and pelvis was performed
following the standard protocol without IV contrast.

[Series 2: rtn a/p w/o · axial · non-contrast · 0.79mm/px · z∈[-526,-92]mm · 14 of 95 slices shown, 16 images]
[im 4/95  soft-tissue]
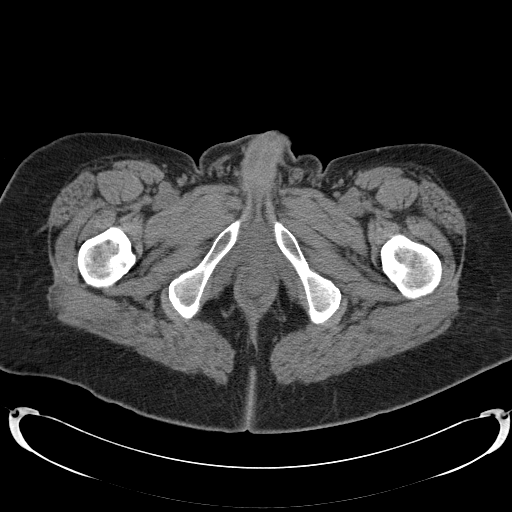
[im 4/95  bone]
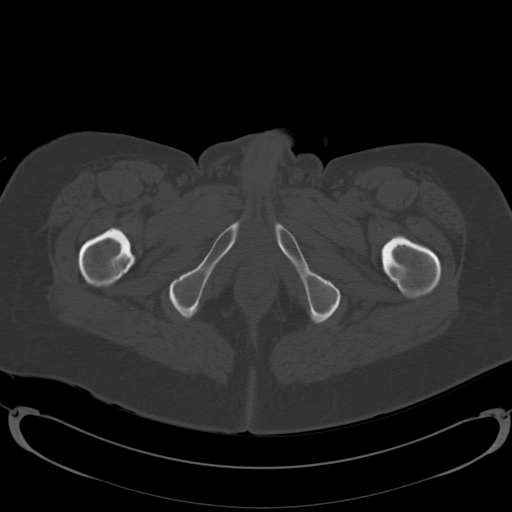
[im 12/95  soft-tissue]
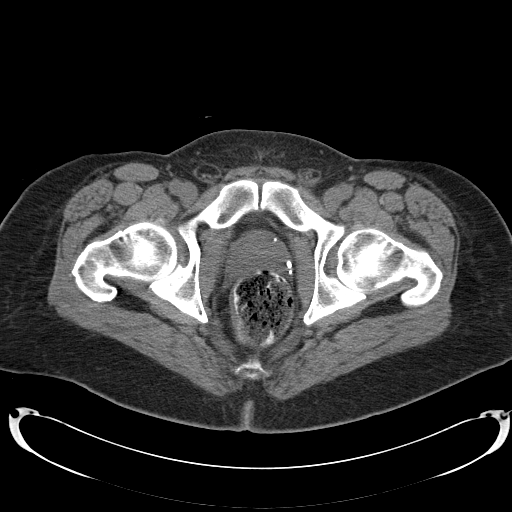
[im 19/95  soft-tissue]
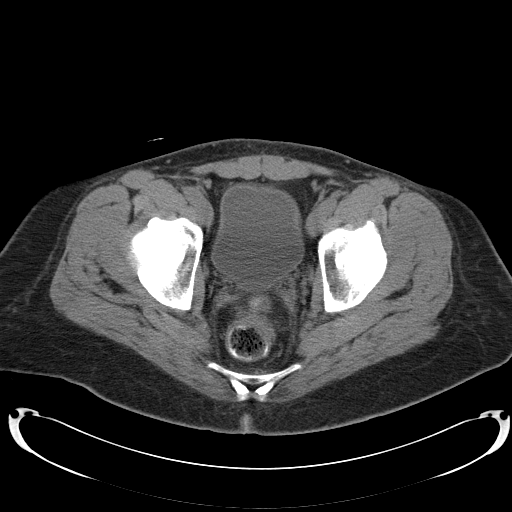
[im 27/95  soft-tissue]
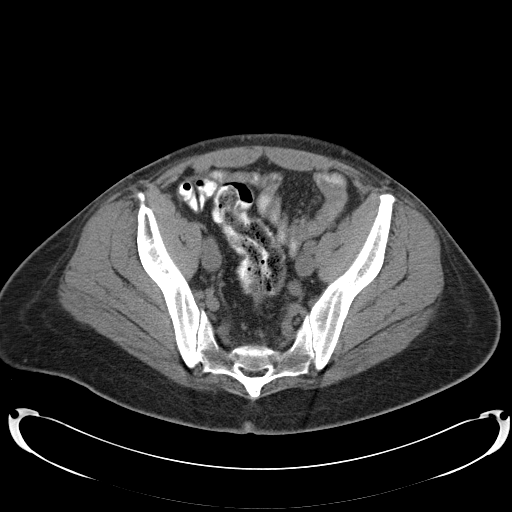
[im 31/95  soft-tissue]
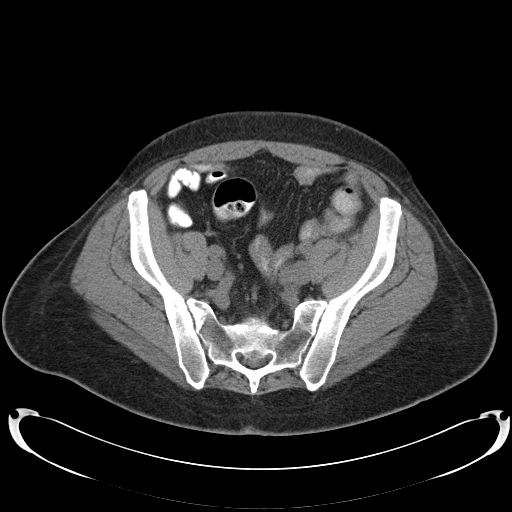
[im 38/95  soft-tissue]
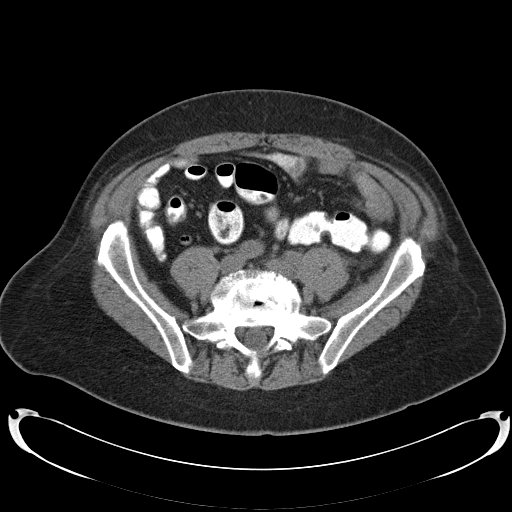
[im 46/95  soft-tissue]
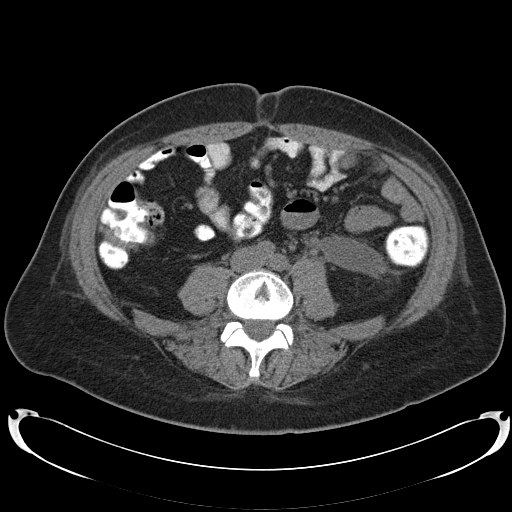
[im 49/95  soft-tissue]
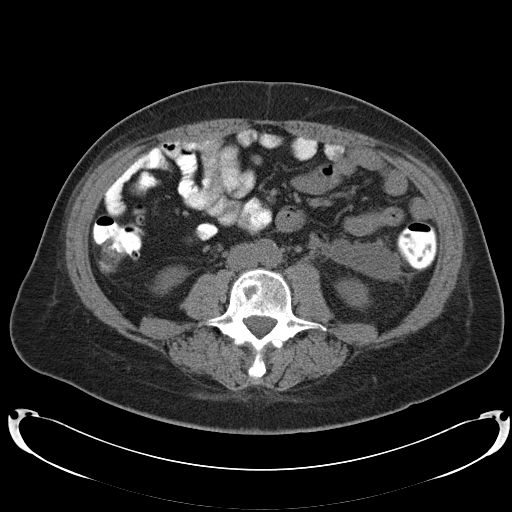
[im 57/95  soft-tissue]
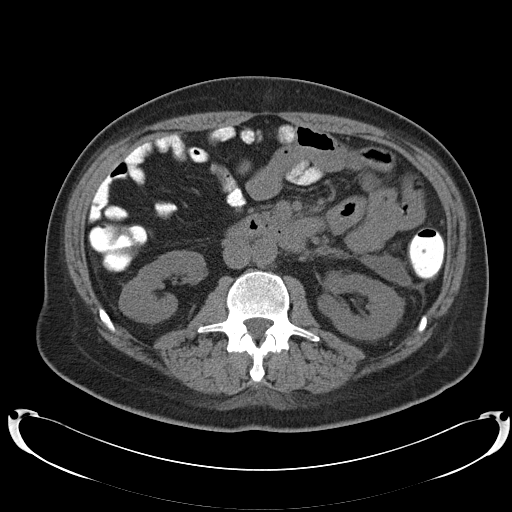
[im 57/95  bone]
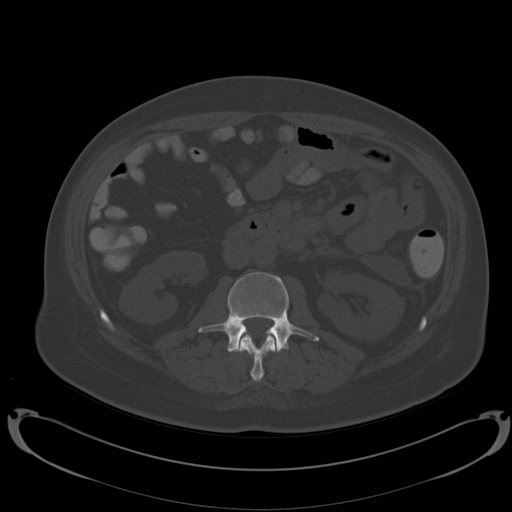
[im 64/95  soft-tissue]
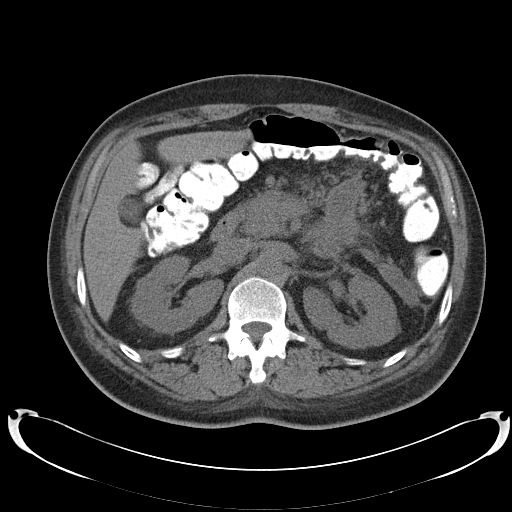
[im 72/95  soft-tissue]
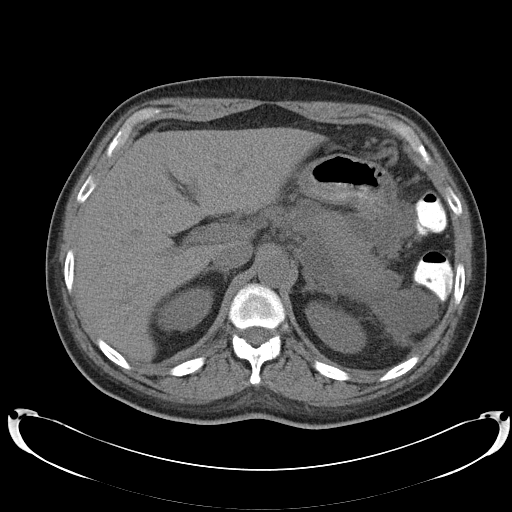
[im 76/95  soft-tissue]
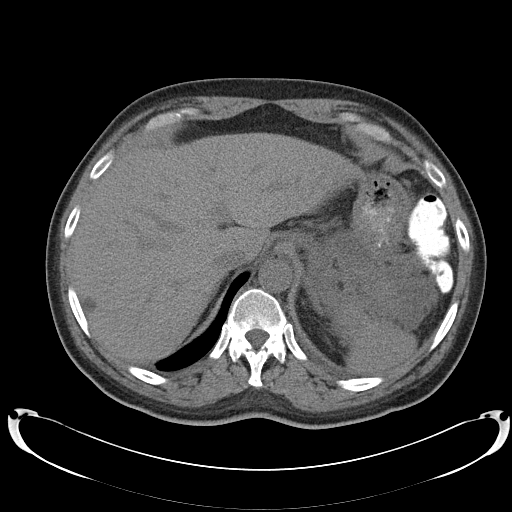
[im 83/95  soft-tissue]
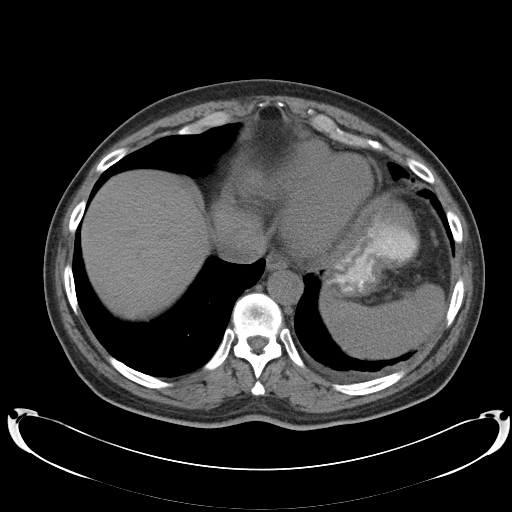
[im 91/95  soft-tissue]
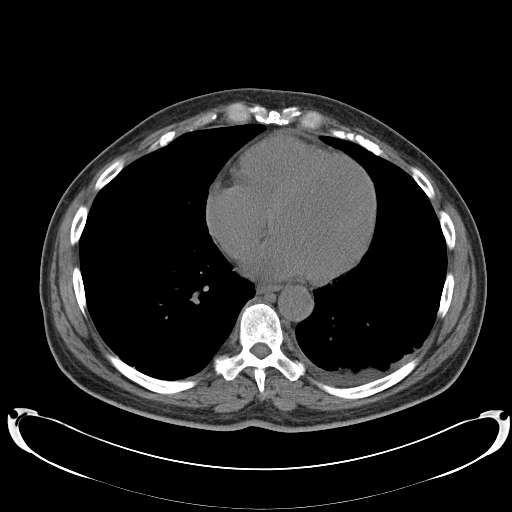

[Series 602: <mpr thick range> · coronal · 0.92mm/px · 3 of 88 slices shown]
[im 30/88  soft-tissue]
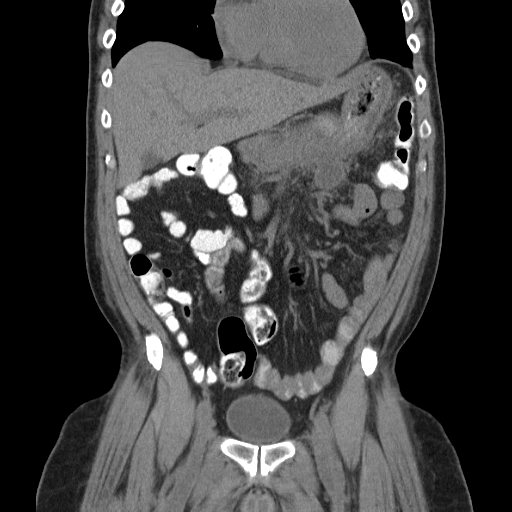
[im 39/88  soft-tissue]
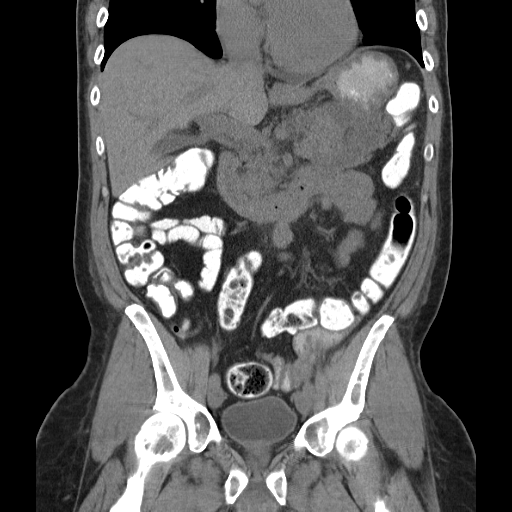
[im 49/88  soft-tissue]
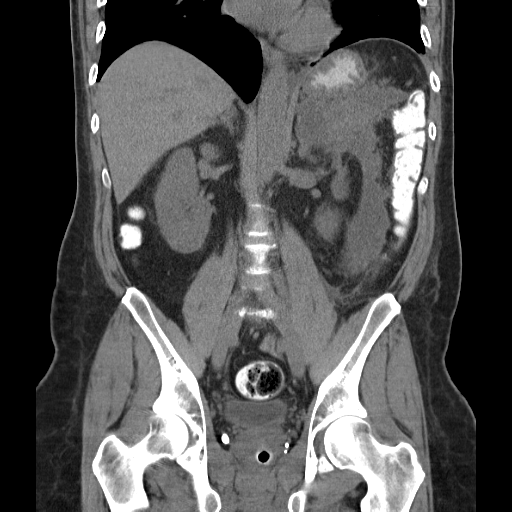

[17 of 46 positions shown; findings below may reference images not displayed]

FINDINGS: Lower chest: Resolution of right pleural effusion and right lower
lobe airspace disease. Improvement in left lower lobe atelectasis
and effusion which is now mild.

Hepatobiliary: 15 mm cyst in the right lateral liver is unchanged.
No new liver lesion. Gallbladder and bile ducts normal.

Pancreas: Peripancreatic edema and fluid collections have improved
in the interval. There is less stranding in the mesentery. Fluid
collection in the anterior para renal space measures 2.2 x 7.2 cm
and is slightly improved. No new fluid collection identified. No
pancreatic calcifications.

Spleen: Negative

Adrenals/Urinary Tract: Normal kidneys and adrenal glands. No mass
or obstruction or stone. Normal urinary bladder.

Stomach/Bowel: Negative for bowel obstruction. No bowel edema.
Appendix normal.

Vascular/Lymphatic: Normal aorta and IVC. Negative for
lymphadenopathy.

Reproductive: Normal prostate.

Other: No free fluid in the pelvis.

Musculoskeletal: Schmorl's nodes with disc degeneration at L3-4,
L4-5, L5-S1. No acute skeletal abnormality.
IMPRESSION: . Changes of pancreatitis with peripancreatic edema and fluid
collections show interval improvement. No new fluid collection or
pseudocyst identified. No free fluid.

Improvement in bilateral pleural effusion and bibasilar atelectasis
since the prior study.

## 2020-04-05 DEATH — deceased
